# Patient Record
Sex: Female | Born: 1965 | Race: White | Hispanic: No | Marital: Married | Smoking: Current every day smoker
Health system: Southern US, Community
[De-identification: ages and names within clinical notes are randomized; demographics above are authoritative.]

## PROBLEM LIST (undated history)

## (undated) DIAGNOSIS — E559 Vitamin D deficiency, unspecified: Secondary | ICD-10-CM

## (undated) DIAGNOSIS — E538 Deficiency of other specified B group vitamins: Secondary | ICD-10-CM

## (undated) DIAGNOSIS — E079 Disorder of thyroid, unspecified: Secondary | ICD-10-CM

## (undated) DIAGNOSIS — R142 Eructation: Secondary | ICD-10-CM

## (undated) DIAGNOSIS — E785 Hyperlipidemia, unspecified: Secondary | ICD-10-CM

## (undated) DIAGNOSIS — I10 Essential (primary) hypertension: Secondary | ICD-10-CM

## (undated) HISTORY — DX: Deficiency of other specified B group vitamins: E53.8

## (undated) HISTORY — DX: Eructation: R14.2

## (undated) HISTORY — DX: Hyperlipidemia, unspecified: E78.5

## (undated) HISTORY — DX: Essential (primary) hypertension: I10

## (undated) HISTORY — PX: TUBAL LIGATION: SHX77

## (undated) HISTORY — PX: TONSILLECTOMY AND ADENOIDECTOMY: SHX28

## (undated) HISTORY — PX: COLONOSCOPY: SHX174

## (undated) HISTORY — PX: UPPER GASTROINTESTINAL ENDOSCOPY: SHX188

## (undated) HISTORY — DX: Vitamin D deficiency, unspecified: E55.9

## (undated) HISTORY — DX: Disorder of thyroid, unspecified: E07.9

---

## 2008-03-06 ENCOUNTER — Emergency Department (HOSPITAL_COMMUNITY): Admission: EM | Admit: 2008-03-06 | Discharge: 2008-03-06 | Payer: Self-pay | Admitting: Emergency Medicine

## 2009-08-09 ENCOUNTER — Ambulatory Visit: Payer: Self-pay | Admitting: Cardiology

## 2013-08-17 ENCOUNTER — Encounter: Payer: Self-pay | Admitting: Neurology

## 2013-08-18 ENCOUNTER — Ambulatory Visit: Payer: Self-pay | Admitting: Neurology

## 2013-08-18 ENCOUNTER — Encounter: Payer: Self-pay | Admitting: Neurology

## 2013-08-18 ENCOUNTER — Ambulatory Visit (INDEPENDENT_AMBULATORY_CARE_PROVIDER_SITE_OTHER): Payer: PRIVATE HEALTH INSURANCE | Admitting: Neurology

## 2013-08-18 ENCOUNTER — Encounter (INDEPENDENT_AMBULATORY_CARE_PROVIDER_SITE_OTHER): Payer: Self-pay

## 2013-08-18 VITALS — BP 129/83 | HR 78 | Ht 62.5 in | Wt 161.0 lb

## 2013-08-18 DIAGNOSIS — H532 Diplopia: Secondary | ICD-10-CM

## 2013-08-18 DIAGNOSIS — G43909 Migraine, unspecified, not intractable, without status migrainosus: Secondary | ICD-10-CM | POA: Insufficient documentation

## 2013-08-18 DIAGNOSIS — R42 Dizziness and giddiness: Secondary | ICD-10-CM | POA: Insufficient documentation
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
## 2013-08-25 ENCOUNTER — Other Ambulatory Visit: Payer: PRIVATE HEALTH INSURANCE

## 2013-08-26 ENCOUNTER — Telehealth: Payer: Self-pay | Admitting: Neurology

## 2013-08-26 NOTE — Telephone Encounter (Signed)
Patrice with [facility] @ [phone], requesting MRI order scheduled 7/13.  Please fax order to [phone].

## 2013-08-29 NOTE — Telephone Encounter (Signed)
Pt's information was faxed over to [facility], attn: Seymour, confirmation received.

## 2013-08-31 ENCOUNTER — Other Ambulatory Visit: Payer: PRIVATE HEALTH INSURANCE

## 2013-08-31 ENCOUNTER — Telehealth: Payer: Self-pay | Admitting: Neurology

## 2013-08-31 NOTE — Telephone Encounter (Signed)
Pt called back and I informed pt of the information that I receive from [facility], stating that they were going to fax the results over to our office. Pt verbalized understanding.

## 2013-08-31 NOTE — Telephone Encounter (Signed)
I have called her MRI result from [facility], MRI cervical showed moderate sized left paracentral, disc protrusion, at C6 and 7, causing left foraminal stenosis, with impingement on the existing left C7 nerve roots, mild impingement on the left side of the cervical cord, and the minimum central canal stenosis  MRI of the brain showed mild small vessel disease,  I have advised her to bring MRI CD to review, keep her appointment in July 27

## 2013-08-31 NOTE — Telephone Encounter (Signed)
Dr.Yan see your in box patient calling about MRI.

## 2013-08-31 NOTE — Telephone Encounter (Signed)
Called pt and could not leave a voice mail for pt, because pt does not have one set up yet. I wanted to let the pt know that we have not receive the MRI results, but I did request them and [facility] will be faxing the results over to our office.

## 2013-09-06 ENCOUNTER — Encounter: Payer: Self-pay | Admitting: Neurology

## 2013-09-13 ENCOUNTER — Ambulatory Visit: Payer: PRIVATE HEALTH INSURANCE | Admitting: Neurology

## 2013-09-14 ENCOUNTER — Ambulatory Visit (INDEPENDENT_AMBULATORY_CARE_PROVIDER_SITE_OTHER): Payer: PRIVATE HEALTH INSURANCE | Admitting: Neurology

## 2013-09-14 ENCOUNTER — Encounter: Payer: Self-pay | Admitting: Neurology

## 2013-09-14 VITALS — BP 129/83 | HR 71 | Ht 62.5 in | Wt 161.0 lb

## 2013-09-14 DIAGNOSIS — M542 Cervicalgia: Secondary | ICD-10-CM

## 2013-09-14 MED ORDER — TOPIRAMATE 25 MG PO TABS: ORAL_TABLET | ORAL | Status: DC
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
## 2013-09-15 ENCOUNTER — Telehealth: Payer: Self-pay | Admitting: Neurology

## 2013-09-15 NOTE — Telephone Encounter (Signed)
Called [name] back and left her a message asking her to call me back and she could just get me paged so we want play phone tag.

## 2013-09-15 NOTE — Telephone Encounter (Signed)
[name] from [facility] calling to discuss with Sherry Cameron the images that are on the CD that we have, wants to make sure we know how to access the images before she sends another one. Please return call and advise.

## 2013-09-15 NOTE — Telephone Encounter (Signed)
Called back and left her another message. We missed each other call again.

## 2013-09-16 NOTE — Telephone Encounter (Signed)
Called back and left another message we do need CD mailed.

## 2013-09-20 NOTE — Telephone Encounter (Signed)
I have talked with her insurance, peer to peer review for MRA,   MRA brain [id number] valid October 15 2013. and neck, [id number]

## 2013-09-22 NOTE — Telephone Encounter (Signed)
Noted  

## 2013-09-26 ENCOUNTER — Telehealth: Payer: Self-pay | Admitting: Neurology

## 2013-09-26 NOTE — Telephone Encounter (Signed)
° °  Last times she saw Dr. Krista Blue was a couple weeks ago Issues she was having last night scared her (numbness of the right side of her face lasting 4-5 hours) Best number [phone]

## 2013-09-26 NOTE — Telephone Encounter (Signed)
Please call patient, extensive evaluation showed no significant abnormality so far, will address her questions during her scheduled visit in August 20

## 2013-09-26 NOTE — Telephone Encounter (Signed)
Patient saw you on 09/14/13 with neck pain. She saw you on 08/18/13 vertigo/double vision. She called today with complaint of facial numbness on last night. What are your recommendations?

## 2013-09-26 NOTE — Telephone Encounter (Signed)
Called patient back and informed her that if the facial numbness continues she should go to the ER. Dr. Krista Blue will address this concern on 8/20 appointment. Patient verbalizes understanding.

## 2013-10-05 ENCOUNTER — Encounter: Payer: Self-pay | Admitting: Neurology

## 2013-10-06 ENCOUNTER — Ambulatory Visit (INDEPENDENT_AMBULATORY_CARE_PROVIDER_SITE_OTHER): Payer: PRIVATE HEALTH INSURANCE | Admitting: Neurology

## 2013-10-06 ENCOUNTER — Encounter (INDEPENDENT_AMBULATORY_CARE_PROVIDER_SITE_OTHER): Payer: Self-pay

## 2013-10-06 DIAGNOSIS — Z0289 Encounter for other administrative examinations: Secondary | ICD-10-CM

## 2013-10-06 DIAGNOSIS — M542 Cervicalgia: Secondary | ICD-10-CM
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
## 2013-10-07 ENCOUNTER — Telehealth: Payer: Self-pay | Admitting: Neurology

## 2013-10-07 NOTE — Telephone Encounter (Signed)
I called patient, but I was able to review her MRIs CDs,

## 2013-10-07 NOTE — Procedures (Signed)
   NCS (NERVE CONDUCTION STUDY) WITH EMG (ELECTROMYOGRAPHY) REPORT   STUDY DATE: October 06 2013 PATIENT NAME: Sherry Cameron DOB: [birth year] MRN: [id number]    TECHNOLOGIST: Laretta Alstrom ELECTROMYOGRAPHER: Marcial Pacas M.D.  CLINICAL INFORMATION:   48 years old right-handed female, complains of dizziness spells, MRI of cervical spine has demonstrated leftward herniated disc at left C6, 7, with potential foraminal stenosis  FINDINGS: NERVE CONDUCTION STUDY:  Bilateral median, ulnar sensory and motor responses were normal.  NEEDLE ELECTROMYOGRAPHY: Selected needle examination was performed at left upper extremity muscles, in the left cervical paraspinal muscles.  Left biceps, triceps, deltoid, extensor digital communis, flexor carpi ulnaris, pronator teres. was normal  There was no spontaneous activity at left cervical paraspinal muscles, left C6-7-5  IMPRESSION:   This is a normal study. There was no electrodiagnostic evidence of left upper extremity neuropathy, or left cervical radiculopathy   INTERPRETING PHYSICIAN:   Marcial Pacas M.D. Ph.D. [facility] [address], [place],  [address] [phone]

## 2014-01-24 ENCOUNTER — Telehealth: Payer: Self-pay | Admitting: *Deleted

## 2014-01-24 NOTE — Telephone Encounter (Signed)
Fax request to [facility] on 01/24/14.

## 2014-01-25 ENCOUNTER — Telehealth: Payer: Self-pay | Admitting: *Deleted

## 2014-01-25 NOTE — Telephone Encounter (Signed)
Note from [facility] receive on 01/25/14.

## 2014-01-27 ENCOUNTER — Ambulatory Visit: Payer: PRIVATE HEALTH INSURANCE | Admitting: Neurology

## 2014-01-27 NOTE — Telephone Encounter (Signed)
Erroneous encounter

## 2014-02-01 ENCOUNTER — Telehealth: Payer: Self-pay | Admitting: Neurology

## 2014-02-01 NOTE — Telephone Encounter (Signed)
I have reviewed MRI report from [facility] dated September 23 2013, MR a of neck showed no significant carotid artery stenosis, mild smooth narrowing of the distal right vertebral artery, cervical spine normal spinal cord signal intensity, moderate left paracentral disc protrusion at C6-7:, Causing left foraminal stenosis, with impingement on the existing left C7 nerve root was, mild impingement the left side of the cervical cord, and minimal central spinal stenosis, next  MRI of the brain, high intensity T2 weighted foci in the periventricular subcortical white matter, has a typical aches appearance of white matter ischemic changes, this findings does not have the typical appearance of Dawson's fingers, which is associated with MS,   Lumbar x-ray no acute findings  Cervical x-ray degenerative disc disease,  Pelvic AP no acute findings

## 2016-03-20 ENCOUNTER — Encounter: Payer: Self-pay | Admitting: Gastroenterology

## 2016-05-02 ENCOUNTER — Ambulatory Visit (INDEPENDENT_AMBULATORY_CARE_PROVIDER_SITE_OTHER): Payer: 59 | Admitting: Gastroenterology

## 2016-05-02 ENCOUNTER — Encounter (INDEPENDENT_AMBULATORY_CARE_PROVIDER_SITE_OTHER): Payer: Self-pay

## 2016-05-02 ENCOUNTER — Encounter: Payer: Self-pay | Admitting: Gastroenterology

## 2016-05-02 VITALS — BP 110/70 | HR 76 | Ht 62.5 in | Wt 159.0 lb

## 2016-05-02 DIAGNOSIS — R142 Eructation: Secondary | ICD-10-CM

## 2016-05-02 DIAGNOSIS — R110 Nausea: Secondary | ICD-10-CM | POA: Diagnosis not present

## 2016-05-02 DIAGNOSIS — R140 Abdominal distension (gaseous): Secondary | ICD-10-CM | POA: Diagnosis not present

## 2016-05-02 DIAGNOSIS — K582 Mixed irritable bowel syndrome: Secondary | ICD-10-CM

## 2016-05-02 MED ORDER — ONDANSETRON HCL 4 MG PO TABS: 4.0000 mg | ORAL_TABLET | Freq: Three times a day (TID) | ORAL | 1 refills | Status: DC | PRN

## 2016-05-02 NOTE — Patient Instructions (Signed)
If you are age 53 or older, your body mass index should be between 23-30. Your Body mass index is 28.62 kg/m. If this is out of the aforementioned range listed, please consider follow up with your Primary Care Provider.  If you are age 39 or younger, your body mass index should be between 19-25. Your Body mass index is 28.62 kg/m. If this is out of the aformentioned range listed, please consider follow up with your Primary Care Provider.   Please call in 2-3 weeks with an update. If no improvement we will proceed with the upper endoscopy as discussed. Allura RN- [phone]   Thank you for choosing [facility]  Dr Wilfrid Lund III

## 2016-05-02 NOTE — Progress Notes (Signed)
[facility] Consult Note:  History: Sherry Cameron 05/02/2016  Referring physician: Talmage Coin, MD  Reason for consult/chief complaint: Nausea (x3 years; occurs frequently but is becoming even more frequent now; started initially after drinking large amounts of soda-she stopped this sometime ago but nausea never got better; nausea is worse in the morning and wakes her at night; can also be bad in evenings; no vomiting); belching (frequent belching); and change in bowels (has some constipation and diarrhea; constipation is new in the last 2 years; does have some sweating and lower abdominal cramping before bowel movements)   Subjective  HPI:  This 51 year old woman referred by primary care for 3 years of persistent nausea. She says she was evaluated with both upper endoscopy as well as gallbladder ultrasound and HIDA scan. This is all by the patient report, as no records are currently available. She says symptoms got much better when she stopped drinking soda with artificial sweetener. However things have gradually returned, she also has associated belching, and generalized abdominal bloating with gas "trapped in my stomach all the way up to my chest". She has many years of all thank constipation and diarrhea with some lower abdominal cramps and sweating and pain on the bottom of her feet after eating certain foods such as salads. The nausea almost always occurs upon awakening in the morning, and sometimes happens overnight and wakes her from sleep. She denies rectal bleeding, appetite is been good and weight stable. 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 She has had several episodes over the last 6-9 months that she describes as a "gallbladder attack". She will have tight chronic crampy upper abdominal pain that will last about half an hour and finally subsided. It does not occur with meals.  ROS:  Review of Systems  Constitutional: Negative for  appetite change and unexpected weight change.  HENT: Negative for mouth sores and voice change.   Eyes: Negative for pain and redness.  Respiratory: Negative for cough and shortness of breath.   Cardiovascular: Negative for chest pain and palpitations.  Genitourinary: Negative for dysuria and hematuria.  Musculoskeletal: Negative for arthralgias and myalgias.  Skin: Negative for pallor and rash.  Neurological: Negative for weakness and headaches.  Hematological: Negative for adenopathy.   She denies blurred or double vision or gait unsteadiness.  Past Medical History: Past Medical History:  Diagnosis Date  . Hyperlipidemia   . Migraine   . Thyroid disease      Past Surgical History: Past Surgical History:  Procedure Laterality Date  . TONSILLECTOMY AND ADENOIDECTOMY    . TUBAL LIGATION       Family History: Family History  Problem Relation Age of Onset  . Dementia Mother   . Stroke Mother   . Cancer - Lung Father   . Esophageal cancer Father     felt secondary to lung cancer  . Heart disease Brother     felt related to drug use  . Diabetes Brother   . Colon cancer Neg Hx   . Colon polyps Neg Hx     Social History: Social History   Social History  . Marital status: Married    Spouse name: N/A  . Number of children: 2  . Years of education: 12   Occupational History  .      Shipping Mgr.   Social History Main Topics  . Smoking status: Current Every Day Smoker  . Smokeless tobacco: Never Used  . Alcohol  use No  . Drug use: No  . Sexual activity: Not Asked   Other Topics Concern  . None   Social History Narrative   Patient lives at home with her husband. Patient works full time as a Loss adjuster, chartered.    Education  High school.   Right handed.   Caffeine three sweet teas daily.          Allergies: Allergies  Allergen Reactions  . Augmentin [Amoxicillin-Pot Clavulanate] Other (See Comments)  . Contrast Media [Iodinated Diagnostic Agents]  Itching    MRI CONTRAST-    Outpatient Meds: Current Outpatient Prescriptions  Medication Sig Dispense Refill  . CARTIA XT 240 MG 24 hr capsule     . clonazePAM (KLONOPIN) 0.5 MG tablet Take 0.5 mg by mouth 2 (two) times daily as needed for anxiety.    Marland Kitchen diltiazem (DILACOR XR) 240 MG 24 hr capsule Take 240 mg by mouth daily.    Marland Kitchen escitalopram (LEXAPRO) 20 MG tablet Take 20 mg by mouth daily.    . hydrochlorothiazide (HYDRODIURIL) 25 MG tablet Take 25 mg by mouth daily.    Marland Kitchen ibuprofen (ADVIL,MOTRIN) 800 MG tablet Take 800 mg by mouth every 8 (eight) hours as needed.    Marland Kitchen levothyroxine (SYNTHROID, LEVOTHROID) 112 MCG tablet Take 112 mcg by mouth daily before breakfast.    . omeprazole (PRILOSEC) 20 MG capsule Take 20 mg by mouth daily.    . potassium chloride (KLOR-CON) 20 MEQ packet Take 20 mEq by mouth 2 (two) times daily.    . ondansetron (ZOFRAN) 4 MG tablet Take 1 tablet (4 mg total) by mouth every 8 (eight) hours as needed for nausea or vomiting. 45 tablet 1   No current facility-administered medications for this visit.       ___________________________________________________________________ Objective   Exam:  BP 110/70   Pulse 76   Ht 5' 2.5" (1.588 m)   Wt 159 lb (72.1 kg)   BMI 28.62 kg/m    General: this is a(n) Well-appearing woman, normal vocal quality, no muscle wasting   Eyes: sclera anicteric, no redness  ENT: oral mucosa moist without lesions, no cervical or supraclavicular lymphadenopathy, good dentition  CV: RRR without murmur, S1/S2, no JVD, no peripheral edema  Resp: clear to auscultation bilaterally, normal RR and effort noted  GI: soft, mild LUQ tenderness, with active bowel sounds. No guarding or palpable organomegaly noted.  Skin; warm and dry, no rash or jaundice noted  Neuro: awake, alert and oriented x 3. Normal gross motor function and fluent speech  Labs:  Reports low iron, no records Reports neg FOBT  No imaging reports. She  appears to have had an MRI of the brain in July 2015, but the report cannot be pulled up from Epic. She recalls being told it was normal.   Assessment: Encounter Diagnoses  Name Primary?  . Nausea without vomiting Yes  . Belching   . Abdominal bloating   . Irritable bowel syndrome with both constipation and diarrhea     Where she has had a previous endoscopic workup for this, the yield of an endoscopy at this point seems likely to be low. She seems to have symptoms of a global functional disorder.  Plan:  IBS common food intolerance dietary recommendations given. 2-3 week trial of ondansetron 4 mg every 8 hours as needed She will call us with an update at that point. If not improved, we can pursue an upper endoscopy.  Thank you for the courtesy of  this consult.  Please call me with any questions or concerns.  Nelida Meuse III  CC: Talmage Coin, MD

## 2016-05-27 ENCOUNTER — Telehealth: Payer: Self-pay | Admitting: Gastroenterology

## 2016-05-27 NOTE — Telephone Encounter (Signed)
Yes, I would like to proceed with EGD if she is agreeable.  If she has not already done so, please eliminate all caffeine.

## 2016-05-27 NOTE — Telephone Encounter (Signed)
Patient has been taking the ondansetron every day, sometimes BID and is still having nausea. Looking at last note looks like you had suggested next step of EGD. Is this how you want to proceed?

## 2016-05-28 NOTE — Telephone Encounter (Signed)
Patient would like to proceed with the EGD, will begin to eliminate caffeine. Scheduled for pre-visit on 4/13 and procedure on 5/7.

## 2016-05-30 ENCOUNTER — Ambulatory Visit (AMBULATORY_SURGERY_CENTER): Payer: Self-pay

## 2016-05-30 VITALS — Ht 62.0 in | Wt 158.0 lb

## 2016-05-30 DIAGNOSIS — R110 Nausea: Secondary | ICD-10-CM

## 2016-05-30 NOTE — Progress Notes (Signed)
No allergies to eggs or soy No diet meds No home oxygen No past problems with anesthesia  Registered emmi 

## 2016-06-23 ENCOUNTER — Encounter: Payer: Self-pay | Admitting: Gastroenterology

## 2016-06-23 ENCOUNTER — Ambulatory Visit (AMBULATORY_SURGERY_CENTER): Payer: 59 | Admitting: Gastroenterology

## 2016-06-23 VITALS — BP 136/79 | HR 66 | Temp 98.7°F | Resp 15 | Ht 62.0 in | Wt 158.0 lb

## 2016-06-23 DIAGNOSIS — R1033 Periumbilical pain: Secondary | ICD-10-CM | POA: Diagnosis not present

## 2016-06-23 DIAGNOSIS — R110 Nausea: Secondary | ICD-10-CM

## 2016-06-23 DIAGNOSIS — R197 Diarrhea, unspecified: Secondary | ICD-10-CM | POA: Diagnosis not present

## 2016-06-23 DIAGNOSIS — K3189 Other diseases of stomach and duodenum: Secondary | ICD-10-CM | POA: Diagnosis not present

## 2016-06-23 DIAGNOSIS — K2950 Unspecified chronic gastritis without bleeding: Secondary | ICD-10-CM

## 2016-06-23 MED ORDER — SODIUM CHLORIDE 0.9 % IV SOLN
500.0000 mL | INTRAVENOUS | Status: DC
Start: 2016-06-23 — End: 2017-08-24

## 2016-06-23 NOTE — Progress Notes (Signed)
A/ox3, pleased with MAC, report to RN 

## 2016-06-23 NOTE — Progress Notes (Signed)
Pt. Reports no change in surgical or medical history since her pre-visit 05/30/2016.

## 2016-06-23 NOTE — Op Note (Signed)
[facility] Patient Name: Sherry Cameron Procedure Date: 06/23/2016 10:11 AM MRN: [id number] Endoscopist: Mallie Mussel L. Loletha Carrow , MD Age: 51 Referring MD:  Date of Birth: [birth year] Gender: Female Account #: [id number] Procedure:                Upper GI endoscopy Indications:              Periumbilical abdominal pain, Diarrhea (alternating                            with constipation), Nausea Medicines:                Monitored Anesthesia Care Procedure:                Pre-Anesthesia Assessment:                           - Prior to the procedure, a History and Physical                            was performed, and patient medications and                            allergies were reviewed. The patient's tolerance of                            previous anesthesia was also reviewed. The risks                            and benefits of the procedure and the sedation                            options and risks were discussed with the patient.                            All questions were answered, and informed consent                            was obtained. Prior Anticoagulants: The patient has                            taken no previous anticoagulant or antiplatelet                            agents. ASA Grade Assessment: II - A patient with                            mild systemic disease. After reviewing the risks                            and benefits, the patient was deemed in                            satisfactory condition to undergo the procedure.  After obtaining informed consent, the endoscope was                            passed under direct vision. Throughout the                            procedure, the patient's blood pressure, pulse, and                            oxygen saturations were monitored continuously. The                            Endoscope was introduced through the mouth, and                            advanced to the second part of  duodenum. The upper                            GI endoscopy was accomplished without difficulty.                            The patient tolerated the procedure well. Scope In: Scope Out: Findings:                 The esophagus was normal.                           The entire examined stomach was normal. Biopsies                            were taken from the antrum with a cold forceps for                            histology.                           The cardia and gastric fundus were normal on                            retroflexion.                           The examined duodenum was normal. Biopsies for                            histology were taken with a cold forceps for                            evaluation of celiac disease. Complications:            No immediate complications. Estimated Blood Loss:     Estimated blood loss: none. Impression:               - Normal esophagus.                           - Normal  stomach. Biopsied.                           - Normal examined duodenum. Biopsied.                           If biopsies are normal, the overall clinical                            picture is most consistent with irritable bowel                            syndrome. Recommendation:           - Patient has a contact number available for                            emergencies. The signs and symptoms of potential                            delayed complications were discussed with the                            patient. Return to normal activities tomorrow.                            Written discharge instructions were provided to the                            patient.                           - Resume previous diet.                           - Continue present medications.                           - Await pathology results. Maribel Luis L. Loletha Carrow, MD 06/23/2016 10:31:47 AM This report has been signed electronically.

## 2016-06-23 NOTE — Patient Instructions (Signed)
YOU HAD AN ENDOSCOPIC PROCEDURE TODAY: Refer to the procedure report and other information in the discharge instructions given to you for any specific questions about what was found during the examination. If this information does not answer your questions, please call [place] office at [phone] to clarify.   YOU SHOULD EXPECT: Some feelings of bloating in the abdomen. Passage of more gas than usual. Walking can help get rid of the air that was put into your GI tract during the procedure and reduce the bloating. If you had a lower endoscopy (such as a colonoscopy or flexible sigmoidoscopy) you may notice spotting of blood in your stool or on the toilet paper. Some abdominal soreness may be present for a day or two, also.  DIET: Your first meal following the procedure should be a light meal and then it is ok to progress to your normal diet. A half-sandwich or bowl of soup is an example of a good first meal. Heavy or fried foods are harder to digest and may make you feel nauseous or bloated. Drink plenty of fluids but you should avoid alcoholic beverages for 24 hours. If you had a esophageal dilation, please see attached instructions for diet.    ACTIVITY: Your care partner should take you home directly after the procedure. You should plan to take it easy, moving slowly for the rest of the day. You can resume normal activity the day after the procedure however YOU SHOULD NOT DRIVE, use power tools, machinery or perform tasks that involve climbing or major physical exertion for 24 hours (because of the sedation medicines used during the test).   SYMPTOMS TO REPORT IMMEDIATELY: A gastroenterologist can be reached at any hour. Please call [phone]  for any of the following symptoms:   Following upper endoscopy (EGD, EUS, ERCP, esophageal dilation) Vomiting of blood or coffee ground material  New, significant abdominal pain  New, significant chest pain or pain under the shoulder blades  Painful or  persistently difficult swallowing  New shortness of breath  Black, tarry-looking or red, bloody stools  FOLLOW UP:  If any biopsies were taken you will be contacted by phone or by letter within the next 1-3 weeks. Call [phone]  if you have not heard about the biopsies in 3 weeks.  Please also call with any specific questions about appointments or follow up tests.  

## 2016-06-23 NOTE — Progress Notes (Signed)
Called to room to assist during endoscopic procedure.  Patient ID and intended procedure confirmed with present staff. Received instructions for my participation in the procedure from the performing physician.  

## 2016-06-24 ENCOUNTER — Telehealth: Payer: Self-pay | Admitting: *Deleted

## 2016-06-24 NOTE — Telephone Encounter (Signed)
  Follow up Call-  Call back number 06/23/2016  Post procedure Call Back phone  # [phone]  [phone]  Permission to leave phone message Yes  Some recent data might be hidden     Patient questions:  Do you have a fever, pain , or abdominal swelling? No. Pain Score  0 *  Have you tolerated food without any problems? Yes.    Have you been able to return to your normal activities? Yes.    Do you have any questions about your discharge instructions: Diet   No. Medications  No. Follow up visit  No   Do you have questions or concerns about your Care? No.  Actions: * If pain score is 4 or above: No action needed, pain <4.

## 2016-06-26 ENCOUNTER — Other Ambulatory Visit: Payer: Self-pay

## 2016-06-26 MED ORDER — PROCHLORPERAZINE MALEATE 10 MG PO TABS: 10.0000 mg | ORAL_TABLET | Freq: Every evening | ORAL | 0 refills | Status: DC | PRN

## 2016-07-09 ENCOUNTER — Other Ambulatory Visit: Payer: Self-pay

## 2016-07-09 ENCOUNTER — Telehealth: Payer: Self-pay | Admitting: Gastroenterology

## 2016-07-09 DIAGNOSIS — R1013 Epigastric pain: Secondary | ICD-10-CM

## 2016-07-09 MED ORDER — TRAMADOL HCL 50 MG PO TABS: 50.0000 mg | ORAL_TABLET | Freq: Three times a day (TID) | ORAL | 1 refills | Status: DC | PRN

## 2016-07-09 NOTE — Telephone Encounter (Signed)
Called patient to let her know that I've scheduled her RUQ [place] for this Friday at 5/25 arrive at 8:15 for 8:30 at [facility]. She needs to be NPO after midnight. Also called in pain medication to [facility] in [place], [place]. Patient asked if she could call back as she is at a graduation ceremony this morning.

## 2016-07-09 NOTE — Telephone Encounter (Signed)
Yes, that does sound like a gallbladder attack. Also sounds more severe than the AM nausea she was describing before.  Her [place] workup was years ago.  She needs a RUQ ultrasound this week or next week.  Please send Rx for tramadol 50 mg, one tablet by mouth every 8 hour as needed for pain (in case has another attack before we figure this out).  Disp #12, RF 1

## 2016-07-09 NOTE — Telephone Encounter (Signed)
Patient reports that around 11:00 pm last night, she started having severe epigastric pain, radiating sternally and to her back. Reports having nausea but no vomiting. She also had white stools yesterday. She is taking her medication as directed. She says that the pain is easing up a little. Patient states she had a workup for gallbladder at [facility].

## 2016-07-09 NOTE — Telephone Encounter (Signed)
Patient called back and she is aware of time of Korea and what time she needs to arrive at [facility].

## 2016-07-11 ENCOUNTER — Ambulatory Visit (HOSPITAL_COMMUNITY): Payer: 59

## 2016-07-11 ENCOUNTER — Observation Stay (HOSPITAL_COMMUNITY): Payer: 59 | Admitting: Anesthesiology

## 2016-07-11 ENCOUNTER — Emergency Department (HOSPITAL_COMMUNITY): Payer: 59

## 2016-07-11 ENCOUNTER — Encounter (HOSPITAL_COMMUNITY): Payer: Self-pay | Admitting: Emergency Medicine

## 2016-07-11 ENCOUNTER — Observation Stay (HOSPITAL_COMMUNITY)
Admission: EM | Admit: 2016-07-11 | Discharge: 2016-07-11 | Disposition: A | Discharge status: Home / Self Care | Payer: 59 | Attending: Surgery | Admitting: Surgery

## 2016-07-11 ENCOUNTER — Encounter (HOSPITAL_COMMUNITY)
Admission: EM | Disposition: A | Discharge status: Home / Self Care | Payer: Self-pay | Source: Home / Self Care | Attending: Emergency Medicine

## 2016-07-11 DIAGNOSIS — Z880 Allergy status to penicillin: Secondary | ICD-10-CM | POA: Diagnosis not present

## 2016-07-11 DIAGNOSIS — F419 Anxiety disorder, unspecified: Secondary | ICD-10-CM | POA: Diagnosis not present

## 2016-07-11 DIAGNOSIS — F329 Major depressive disorder, single episode, unspecified: Secondary | ICD-10-CM | POA: Diagnosis not present

## 2016-07-11 DIAGNOSIS — E039 Hypothyroidism, unspecified: Secondary | ICD-10-CM | POA: Diagnosis not present

## 2016-07-11 DIAGNOSIS — I10 Essential (primary) hypertension: Secondary | ICD-10-CM | POA: Diagnosis not present

## 2016-07-11 DIAGNOSIS — R1011 Right upper quadrant pain: Secondary | ICD-10-CM | POA: Diagnosis present

## 2016-07-11 DIAGNOSIS — F17210 Nicotine dependence, cigarettes, uncomplicated: Secondary | ICD-10-CM | POA: Insufficient documentation

## 2016-07-11 DIAGNOSIS — K8010 Calculus of gallbladder with chronic cholecystitis without obstruction: Principal | ICD-10-CM | POA: Insufficient documentation

## 2016-07-11 DIAGNOSIS — Z79899 Other long term (current) drug therapy: Secondary | ICD-10-CM | POA: Insufficient documentation

## 2016-07-11 DIAGNOSIS — Z91041 Radiographic dye allergy status: Secondary | ICD-10-CM | POA: Diagnosis not present

## 2016-07-11 DIAGNOSIS — K8020 Calculus of gallbladder without cholecystitis without obstruction: Secondary | ICD-10-CM | POA: Diagnosis present

## 2016-07-11 HISTORY — PX: CHOLECYSTECTOMY: SHX55

## 2016-07-11 LAB — CBC
HCT: 37.9 % (ref 36.0–46.0)
HEMOGLOBIN: 12 g/dL (ref 12.0–15.0)
MCH: 25.3 pg — AB (ref 26.0–34.0)
MCHC: 31.7 g/dL (ref 30.0–36.0)
MCV: 79.8 fL (ref 78.0–100.0)
Platelets: 377 10*3/uL (ref 150–400)
RBC: 4.75 MIL/uL (ref 3.87–5.11)
RDW: 15.4 % (ref 11.5–15.5)
WBC: 10.9 10*3/uL — ABNORMAL HIGH (ref 4.0–10.5)

## 2016-07-11 LAB — COMPREHENSIVE METABOLIC PANEL
ALK PHOS: 119 U/L (ref 38–126)
ALT: 16 U/L (ref 14–54)
AST: 20 U/L (ref 15–41)
Albumin: 4.4 g/dL (ref 3.5–5.0)
Anion gap: 10 (ref 5–15)
BUN: 15 mg/dL (ref 6–20)
CALCIUM: 9.5 mg/dL (ref 8.9–10.3)
CO2: 25 mmol/L (ref 22–32)
CREATININE: 1 mg/dL (ref 0.44–1.00)
Chloride: 103 mmol/L (ref 101–111)
GFR calc Af Amer: >60 mL/min (ref >60)
GFR calc non Af Amer: >60 mL/min (ref >60)
Glucose, Bld: 124 mg/dL — ABNORMAL HIGH (ref 65–99)
Potassium: 3.6 mmol/L (ref 3.5–5.1)
Sodium: 138 mmol/L (ref 135–145)
Total Bilirubin: 0.4 mg/dL (ref 0.3–1.2)
Total Protein: 8 g/dL (ref 6.5–8.1)

## 2016-07-11 LAB — LIPASE, BLOOD: Lipase: 26 U/L (ref 11–51)

## 2016-07-11 SURGERY — LAPAROSCOPIC CHOLECYSTECTOMY WITH INTRAOPERATIVE CHOLANGIOGRAM
Anesthesia: General | Site: Abdomen

## 2016-07-11 MED ORDER — ROCURONIUM BROMIDE 50 MG/5ML IV SOSY
PREFILLED_SYRINGE | INTRAVENOUS | Status: AC
  Filled 2016-07-11: qty 5

## 2016-07-11 MED ORDER — CHLORHEXIDINE GLUCONATE CLOTH 2 % EX PADS: 6.0000 | MEDICATED_PAD | Freq: Once | CUTANEOUS | Status: DC

## 2016-07-11 MED ORDER — ROCURONIUM BROMIDE 10 MG/ML (PF) SYRINGE
PREFILLED_SYRINGE | INTRAVENOUS | Status: DC | PRN
  Administered 2016-07-11: 10 mg via INTRAVENOUS
  Administered 2016-07-11: 40 mg via INTRAVENOUS

## 2016-07-11 MED ORDER — ONDANSETRON HCL 4 MG/2ML IJ SOLN
INTRAMUSCULAR | Status: AC
  Filled 2016-07-11: qty 2

## 2016-07-11 MED ORDER — ACETAMINOPHEN 325 MG PO TABS: 650.0000 mg | ORAL_TABLET | Freq: Four times a day (QID) | ORAL | Status: DC | PRN

## 2016-07-11 MED ORDER — MORPHINE SULFATE (PF) 2 MG/ML IV SOLN: 1.0000 mg | INTRAVENOUS | Status: DC | PRN

## 2016-07-11 MED ORDER — CIPROFLOXACIN IN D5W 400 MG/200ML IV SOLN: 400.0000 mg | INTRAVENOUS | Status: DC

## 2016-07-11 MED ORDER — ONDANSETRON HCL 4 MG/2ML IJ SOLN: 4.0000 mg | Freq: Once | INTRAMUSCULAR | Status: DC | PRN

## 2016-07-11 MED ORDER — ONDANSETRON HCL 4 MG/2ML IJ SOLN: 4.0000 mg | Freq: Four times a day (QID) | INTRAMUSCULAR | Status: DC | PRN

## 2016-07-11 MED ORDER — DILTIAZEM HCL ER COATED BEADS 240 MG PO CP24
240.0000 mg | ORAL_CAPSULE | Freq: Every evening | ORAL | Status: DC
Start: 2016-07-11 — End: 2016-07-11

## 2016-07-11 MED ORDER — BUPIVACAINE HCL (PF) 0.25 % IJ SOLN
INTRAMUSCULAR | Status: AC
  Filled 2016-07-11: qty 30

## 2016-07-11 MED ORDER — ACETAMINOPHEN 650 MG RE SUPP: 650.0000 mg | Freq: Four times a day (QID) | RECTAL | Status: DC | PRN

## 2016-07-11 MED ORDER — LIDOCAINE 2% (20 MG/ML) 5 ML SYRINGE
INTRAMUSCULAR | Status: AC
  Filled 2016-07-11: qty 5

## 2016-07-11 MED ORDER — SUCCINYLCHOLINE CHLORIDE 200 MG/10ML IV SOSY
PREFILLED_SYRINGE | INTRAVENOUS | Status: AC
  Filled 2016-07-11: qty 10

## 2016-07-11 MED ORDER — DIPHENHYDRAMINE HCL 25 MG PO CAPS: 25.0000 mg | ORAL_CAPSULE | Freq: Four times a day (QID) | ORAL | Status: DC | PRN

## 2016-07-11 MED ORDER — CIPROFLOXACIN IN D5W 400 MG/200ML IV SOLN
400.0000 mg | Freq: Two times a day (BID) | INTRAVENOUS | Status: DC
  Administered 2016-07-11: 400 mg via INTRAVENOUS

## 2016-07-11 MED ORDER — PROPOFOL 10 MG/ML IV BOLUS
INTRAVENOUS | Status: DC | PRN
  Administered 2016-07-11: 150 mg via INTRAVENOUS

## 2016-07-11 MED ORDER — DEXAMETHASONE SODIUM PHOSPHATE 10 MG/ML IJ SOLN
INTRAMUSCULAR | Status: DC | PRN
  Administered 2016-07-11: 10 mg via INTRAVENOUS

## 2016-07-11 MED ORDER — ONDANSETRON HCL 4 MG/2ML IJ SOLN
INTRAMUSCULAR | Status: DC | PRN
  Administered 2016-07-11: 4 mg via INTRAVENOUS

## 2016-07-11 MED ORDER — ENOXAPARIN SODIUM 40 MG/0.4ML ~~LOC~~ SOLN: 40.0000 mg | SUBCUTANEOUS | Status: DC

## 2016-07-11 MED ORDER — PANTOPRAZOLE SODIUM 40 MG IV SOLR: 40.0000 mg | Freq: Every day | INTRAVENOUS | Status: DC

## 2016-07-11 MED ORDER — PROMETHAZINE HCL 25 MG/ML IJ SOLN
25.0000 mg | Freq: Once | INTRAMUSCULAR | Status: AC
Start: 2016-07-11 — End: 2016-07-11
  Administered 2016-07-11: 25 mg via INTRAVENOUS
  Filled 2016-07-11: qty 1

## 2016-07-11 MED ORDER — DIPHENHYDRAMINE HCL 50 MG/ML IJ SOLN: 25.0000 mg | Freq: Four times a day (QID) | INTRAMUSCULAR | Status: DC | PRN

## 2016-07-11 MED ORDER — SUGAMMADEX SODIUM 200 MG/2ML IV SOLN
INTRAVENOUS | Status: DC | PRN
  Administered 2016-07-11: 150 mg via INTRAVENOUS

## 2016-07-11 MED ORDER — FENTANYL CITRATE (PF) 250 MCG/5ML IJ SOLN
INTRAMUSCULAR | Status: AC
Start: 2016-07-11 — End: 2016-07-11
  Filled 2016-07-11: qty 5

## 2016-07-11 MED ORDER — FENTANYL CITRATE (PF) 100 MCG/2ML IJ SOLN: 25.0000 ug | INTRAMUSCULAR | Status: DC | PRN

## 2016-07-11 MED ORDER — ONDANSETRON 4 MG PO TBDP: 4.0000 mg | ORAL_TABLET | Freq: Four times a day (QID) | ORAL | Status: DC | PRN

## 2016-07-11 MED ORDER — 0.9 % SODIUM CHLORIDE (POUR BTL) OPTIME
TOPICAL | Status: DC | PRN
  Administered 2016-07-11: 1000 mL

## 2016-07-11 MED ORDER — FENTANYL CITRATE (PF) 100 MCG/2ML IJ SOLN
INTRAMUSCULAR | Status: DC | PRN
  Administered 2016-07-11: 150 ug via INTRAVENOUS
  Administered 2016-07-11: 50 ug via INTRAVENOUS

## 2016-07-11 MED ORDER — CIPROFLOXACIN IN D5W 400 MG/200ML IV SOLN
INTRAVENOUS | Status: AC
  Filled 2016-07-11: qty 200

## 2016-07-11 MED ORDER — OXYCODONE HCL 5 MG PO TABS: 5.0000 mg | ORAL_TABLET | ORAL | 0 refills | Status: DC | PRN

## 2016-07-11 MED ORDER — SODIUM CHLORIDE 0.45 % IV SOLN: INTRAVENOUS | Status: DC

## 2016-07-11 MED ORDER — CLONAZEPAM 0.5 MG PO TABS: 0.5000 mg | ORAL_TABLET | Freq: Two times a day (BID) | ORAL | Status: DC | PRN

## 2016-07-11 MED ORDER — IOPAMIDOL (ISOVUE-300) INJECTION 61%
INTRAVENOUS | Status: AC
  Filled 2016-07-11: qty 50

## 2016-07-11 MED ORDER — SIMETHICONE 80 MG PO CHEW: 40.0000 mg | CHEWABLE_TABLET | Freq: Four times a day (QID) | ORAL | Status: DC | PRN

## 2016-07-11 MED ORDER — MIDAZOLAM HCL 2 MG/2ML IJ SOLN
INTRAMUSCULAR | Status: AC
  Filled 2016-07-11: qty 2

## 2016-07-11 MED ORDER — OXYCODONE HCL 5 MG PO TABS: 5.0000 mg | ORAL_TABLET | ORAL | Status: DC | PRN

## 2016-07-11 MED ORDER — PROPOFOL 10 MG/ML IV BOLUS
INTRAVENOUS | Status: AC
  Filled 2016-07-11: qty 20

## 2016-07-11 MED ORDER — MENTHOL 3 MG MT LOZG: 1.0000 | LOZENGE | OROMUCOSAL | Status: DC | PRN

## 2016-07-11 MED ORDER — LIDOCAINE 2% (20 MG/ML) 5 ML SYRINGE
INTRAMUSCULAR | Status: DC | PRN
  Administered 2016-07-11: 100 mg via INTRAVENOUS

## 2016-07-11 MED ORDER — HYDROMORPHONE HCL 1 MG/ML IJ SOLN
1.0000 mg | Freq: Once | INTRAMUSCULAR | Status: AC
  Administered 2016-07-11: 1 mg via INTRAVENOUS
  Filled 2016-07-11: qty 1

## 2016-07-11 MED ORDER — DEXAMETHASONE SODIUM PHOSPHATE 10 MG/ML IJ SOLN
INTRAMUSCULAR | Status: AC
  Filled 2016-07-11: qty 1

## 2016-07-11 MED ORDER — SODIUM CHLORIDE 0.9 % IV BOLUS (SEPSIS)
500.0000 mL | Freq: Once | INTRAVENOUS | Status: AC
  Administered 2016-07-11: 500 mL via INTRAVENOUS

## 2016-07-11 MED ORDER — LACTATED RINGERS IR SOLN
Status: DC | PRN
Start: 2016-07-11 — End: 2016-07-11
  Administered 2016-07-11: 1000 mL

## 2016-07-11 MED ORDER — LEVOTHYROXINE SODIUM 112 MCG PO TABS: 112.0000 ug | ORAL_TABLET | Freq: Every day | ORAL | Status: DC

## 2016-07-11 MED ORDER — LACTATED RINGERS IV SOLN
INTRAVENOUS | Status: DC
  Administered 2016-07-11 (×2): via INTRAVENOUS

## 2016-07-11 MED ORDER — BUPIVACAINE HCL (PF) 0.25 % IJ SOLN
INTRAMUSCULAR | Status: DC | PRN
  Administered 2016-07-11: 30 mL

## 2016-07-11 MED ORDER — HYDRALAZINE HCL 20 MG/ML IJ SOLN: 10.0000 mg | INTRAMUSCULAR | Status: DC | PRN

## 2016-07-11 MED ORDER — ESCITALOPRAM OXALATE 10 MG PO TABS: 20.0000 mg | ORAL_TABLET | Freq: Every evening | ORAL | Status: DC

## 2016-07-11 SURGICAL SUPPLY — 33 items
APPLIER CLIP ROT 10 11.4 M/L (STAPLE) ×3
CABLE HIGH FREQUENCY MONO STRZ (ELECTRODE) ×3 IMPLANT
CHLORAPREP W/TINT 26ML (MISCELLANEOUS) ×3 IMPLANT
CLIP APPLIE ROT 10 11.4 M/L (STAPLE) ×1 IMPLANT
COVER MAYO STAND STRL (DRAPES) IMPLANT
COVER SURGICAL LIGHT HANDLE (MISCELLANEOUS) ×3 IMPLANT
DECANTER SPIKE VIAL GLASS SM (MISCELLANEOUS) IMPLANT
DERMABOND ADVANCED (GAUZE/BANDAGES/DRESSINGS) ×2
DERMABOND ADVANCED .7 DNX12 (GAUZE/BANDAGES/DRESSINGS) ×1 IMPLANT
DRAPE C-ARM 42X120 X-RAY (DRAPES) IMPLANT
ELECT REM PT RETURN 15FT ADLT (MISCELLANEOUS) ×3 IMPLANT
GLOVE BIO SURGEON STRL SZ 6 (GLOVE) ×3 IMPLANT
GLOVE INDICATOR 6.5 STRL GRN (GLOVE) ×3 IMPLANT
GOWN STRL REUS W/TWL LRG LVL3 (GOWN DISPOSABLE) ×3 IMPLANT
GOWN STRL REUS W/TWL XL LVL3 (GOWN DISPOSABLE) ×6 IMPLANT
GRASPER SUT TROCAR 14GX15 (MISCELLANEOUS) IMPLANT
HEMOSTAT SNOW SURGICEL 2X4 (HEMOSTASIS) IMPLANT
IRRIG SUCT STRYKERFLOW 2 WTIP (MISCELLANEOUS)
IRRIGATION SUCT STRKRFLW 2 WTP (MISCELLANEOUS) IMPLANT
KIT BASIN OR (CUSTOM PROCEDURE TRAY) ×3 IMPLANT
NEEDLE INSUFFLATION 14GA 120MM (NEEDLE) ×3 IMPLANT
POUCH SPECIMEN RETRIEVAL 10MM (ENDOMECHANICALS) ×3 IMPLANT
SCISSORS LAP 5X35 DISP (ENDOMECHANICALS) ×3 IMPLANT
SET CHOLANGIOGRAPH MIX (MISCELLANEOUS) IMPLANT
SET IRRIG TUBING LAPAROSCOPIC (IRRIGATION / IRRIGATOR) ×3 IMPLANT
SLEEVE XCEL OPT CAN 5 100 (ENDOMECHANICALS) ×6 IMPLANT
SUT MNCRL AB 4-0 PS2 18 (SUTURE) ×3 IMPLANT
TOWEL OR 17X26 10 PK STRL BLUE (TOWEL DISPOSABLE) ×3 IMPLANT
TOWEL OR NON WOVEN STRL DISP B (DISPOSABLE) IMPLANT
TRAY LAPAROSCOPIC (CUSTOM PROCEDURE TRAY) ×3 IMPLANT
TROCAR BLADELESS OPT 5 100 (ENDOMECHANICALS) ×3 IMPLANT
TROCAR XCEL 12X100 BLDLESS (ENDOMECHANICALS) ×3 IMPLANT
TUBING INSUF HEATED (TUBING) ×3 IMPLANT

## 2016-07-11 NOTE — Transfer of Care (Signed)
Immediate Anesthesia Transfer of Care Note  Patient: Sherry Cameron  Procedure(s) Performed: Procedure(s): LAPAROSCOPIC CHOLECYSTECTOMY (N/A)  Patient Location: PACU  Anesthesia Type:General  Level of Consciousness: awake and alert   Airway & Oxygen Therapy: Patient Spontanous Breathing and Patient connected to face mask oxygen  Post-op Assessment: Report given to RN and Post -op Vital signs reviewed and stable  Post vital signs: Reviewed and stable  Last Vitals:  Vitals:   07/11/16 0638 07/11/16 0908  BP: (!) 150/86 (!) 144/86  Pulse: 68 70  Resp: 14 16  Temp: 36.6 C     Last Pain:  Vitals:   07/11/16 0738  TempSrc:   PainSc: 2          Complications: No apparent anesthesia complications

## 2016-07-11 NOTE — Discharge Instructions (Signed)

## 2016-07-11 NOTE — ED Notes (Signed)
Pt transported to OR

## 2016-07-11 NOTE — Anesthesia Procedure Notes (Signed)
Procedure Name: Intubation Date/Time: 07/11/2016 12:38 PM Performed by: Danley Danker L Patient Re-evaluated:Patient Re-evaluated prior to inductionOxygen Delivery Method: Circle system utilized Preoxygenation: Pre-oxygenation with 100% oxygen Intubation Type: IV induction Ventilation: Mask ventilation without difficulty and Oral airway inserted - appropriate to patient size Laryngoscope Size: Sabra Heck and 2 Grade View: Grade III Tube type: Oral Tube size: 7.5 mm Number of attempts: 2 Airway Equipment and Method: Stylet Placement Confirmation: ETT inserted through vocal cords under direct vision,  positive ETCO2 and breath sounds checked- equal and bilateral Secured at: 21 cm Dental Injury: Teeth and Oropharynx as per pre-operative assessment

## 2016-07-11 NOTE — Progress Notes (Signed)
RN reviewed discharge instructions with patient and family. All questions answered.   Paperwork and prescriptions given.   NT rolled patient down with all belongings to family car. 

## 2016-07-11 NOTE — ED Provider Notes (Signed)
[place] [facility] DEPT Provider Note   CSN: [id number] Arrival date & time: 07/11/16  9509     History   Chief Complaint Chief Complaint  Patient presents with  . Abdominal Pain    HPI Sherry Cameron is a 51 y.o. female who presents to the Emergency Department with RUQ abdominal pain that began at approximately 12:30 AM this morning. She describes the pain as a severe, constant contraction. She reports associated chills, nausea, and loose, gray stools. No vomitting, pruritus, or shoulder pain. She treated the pain with Tramadol x2 (last dose at 3 AM) and nausea with compazine.   She reports a h/o of similar episodes of pain over the last few weeks that will last as long at 18 hours and begin approximately 4 hours after eating. She reports that she was scheduled for an [place] of RUQ at 08:15 this morning and was started on Tramadol on 5/23 by Dr. Loletha Carrow at [facility] for possible gallbadder attacks.  An upper endoscopy on 5/7. No H. Pylori or celiac sprue and was started on compazine. She reports a HIDA scan was performed in [place] 2 years ago, but she does not have the results.   Surgical hx includes tubal ligation. She is a current, every day smoker. She is allergic to Augmentin and iodinated contrast media.   The history is provided by the patient. No language interpreter was used.    Past Medical History:  Diagnosis Date  . Belching   . Hyperlipidemia   . Hypertension   . Thyroid disease     Patient Active Problem List   Diagnosis Date Noted  . Symptomatic cholelithiasis 07/11/2016  . Neck pain on left side 09/14/2013  . Vertigo 08/18/2013  . Double vision 08/18/2013  . Migraine     Past Surgical History:  Procedure Laterality Date  . TONSILLECTOMY AND ADENOIDECTOMY    . TUBAL LIGATION      OB History    No data available     Home Medications    Prior to Admission medications   Medication Sig Start Date End Date Taking? Authorizing Provider  CARTIA XT 240 MG 24 hr  capsule Take 240 mg by mouth every evening.    Yes [provider]  clonazePAM (KLONOPIN) 0.5 MG tablet Take 0.5 mg by mouth 2 (two) times daily as needed for anxiety.   Yes [provider]  escitalopram (LEXAPRO) 20 MG tablet Take 20 mg by mouth every evening.    Yes [provider]  ibuprofen (ADVIL,MOTRIN) 800 MG tablet Take 800 mg by mouth every 8 (eight) hours as needed for mild pain or moderate pain.    Yes [provider]  levothyroxine (SYNTHROID, LEVOTHROID) 112 MCG tablet Take 112 mcg by mouth daily before breakfast.   Yes [provider]  omeprazole (PRILOSEC) 20 MG capsule Take 20 mg by mouth every evening.    Yes [provider]  ondansetron (ZOFRAN) 4 MG tablet Take 1 tablet (4 mg total) by mouth every 8 (eight) hours as needed for nausea or vomiting. 05/02/16  Yes Danis, Kirke Corin, MD  potassium chloride SA (K-DUR,KLOR-CON) 20 MEQ tablet Take 20 mEq by mouth every evening.   Yes [provider]  prochlorperazine (COMPAZINE) 10 MG tablet Take 1 tablet (10 mg total) by mouth at bedtime as needed for nausea or vomiting. 06/26/16  Yes Armbruster, Renelda Loma, MD  oxyCODONE (OXY IR/ROXICODONE) 5 MG immediate release tablet Take 1-2 tablets (5-10 mg total) by mouth every  4 (four) hours as needed for moderate pain. 07/11/16   Jerrye Beavers, PA-C    Family History Family History  Problem Relation Age of Onset  . Dementia Mother   . Stroke Mother   . Cancer - Lung Father   . Esophageal cancer Father        felt secondary to lung cancer  . Heart disease Brother        felt related to drug use  . Diabetes Brother   . Colon cancer Neg Hx   . Colon polyps Neg Hx     Social History Social History  Substance Use Topics  . Smoking status: Current Every Day Smoker  . Smokeless tobacco: Never Used  . Alcohol use No     Allergies   Contrast media [iodinated diagnostic agents] and Augmentin [amoxicillin-pot  clavulanate]   Review of Systems Review of Systems  Constitutional: Positive for chills. Negative for activity change.  Respiratory: Negative for shortness of breath.   Cardiovascular: Negative for chest pain.  Gastrointestinal: Positive for abdominal pain and nausea. Negative for constipation, diarrhea and vomiting.  Genitourinary: Negative for dysuria.  Musculoskeletal: Negative for back pain.  Skin: Negative for rash.  Allergic/Immunologic: Negative for immunocompromised state.  Neurological: Positive for headaches (chronic).  Psychiatric/Behavioral: Negative for confusion.   Physical Exam Updated Vital Signs BP 132/80 (BP Location: Right Arm)   Pulse 65   Temp 97.8 F (36.6 C) (Oral)   Resp 15   Ht 5\' 2"  (1.575 m)   Wt 73.5 kg (162 lb)   LMP 05/31/2015   SpO2 98%   BMI 29.63 kg/m   Physical Exam  Constitutional: She appears well-developed and well-nourished. No distress.  Uncomfortable appearing.   HENT:  Head: Normocephalic and atraumatic.  Eyes: Conjunctivae are normal.  Neck: Neck supple.  Cardiovascular: Normal rate and regular rhythm.  Exam reveals no gallop and no friction rub.   No murmur heard. Pulmonary/Chest: Effort normal and breath sounds normal. No respiratory distress. She has no wheezes. She has no rales.  Abdominal: Soft. She exhibits no distension. Bowel sounds are increased. There is tenderness in the right upper quadrant and epigastric area. There is positive Murphy's sign. There is no guarding and no CVA tenderness.  Musculoskeletal: She exhibits no edema.  Neurological: She is alert.  Skin: Skin is warm and dry. No rash noted. She is not diaphoretic.  Psychiatric: Her behavior is normal.  Nursing note and vitals reviewed.  ED Treatments / Results  Labs (all labs ordered are listed, but only abnormal results are displayed) Labs Reviewed  COMPREHENSIVE METABOLIC PANEL - Abnormal; Notable for the following:       Result Value   Glucose, Bld  124 (*)    All other components within normal limits  CBC - Abnormal; Notable for the following:    WBC 10.9 (*)    MCH 25.3 (*)    All other components within normal limits  LIPASE, BLOOD  SURGICAL PATHOLOGY    EKG  EKG Interpretation None       Radiology No results found.  Procedures Procedures (including critical care time)  Medications Ordered in ED Medications  HYDROmorphone (DILAUDID) injection 1 mg (1 mg Intravenous Given 07/11/16 0709)  promethazine (PHENERGAN) injection 25 mg (25 mg Intravenous Given 07/11/16 0904)  sodium chloride 0.9 % bolus 500 mL (0 mLs Intravenous Stopped 07/11/16 0950)  ciprofloxacin (CIPRO) 400 MG/200ML IVPB (  Override pull for Anesthesia 07/11/16 1245)     Initial  Impression / Assessment and Plan / ED Course  I have reviewed the triage vital signs and the nursing notes.  Pertinent labs & imaging results that were available during my care of the patient were reviewed by me and considered in my medical decision making (see chart for details).     51 year old female with more frequent, worsening episodes of biliary colic. She has been able to control her pain with Advil and most recently Tramadol prescribed by Dr. Loletha Carrow in GI, but presented to the ED after she was unable to control her pain at home. VSS stabile; afebrile, non-tachy. Slight leukocytosis at 10.9. No hyperbilirubinemia or elevated transaminases. RUQ US demonstrating multiple gallstones measuring up to 1.3 cm in diameter. No thickening of the gall bladder wall. Common bile duct measuring 7.6 mm. Patient reports she lives a considerable distance from [place] and has been making multiple trips for imaging, GI visits, and today's ED visit. Discussed and evaluated the patient with Dr. Regenia Skeeter, attending physician. Discussed sending the patient home with medication for main control until she could follow-up with GI; however she is insistent on discussing surgical options. Consulted  general surgery who assessed the patient and will her to med-surg with a possible lab chole IOC later today. Cipro initiated and will continue NPO.   Final Clinical Impressions(s) / ED Diagnoses   Final diagnoses:  RUQ abdominal pain    New Prescriptions      Joanne Gavel, PA-C 07/14/16 1612    Sherwood Gambler, MD 07/22/16 1452

## 2016-07-11 NOTE — Discharge Summary (Signed)
[facility] Discharge Summary   Patient ID: Sherry Cameron MRN: [id number] DOB/AGE: [birth year] 51 y.o.  Admit date: 07/11/2016 Discharge date: 07/11/2016  Admitting Diagnosis: Symptomatic cholelithiasis  Discharge Diagnosis Patient Active Problem List   Diagnosis Date Noted  . Symptomatic cholelithiasis 07/11/2016  . Neck pain on left side 09/14/2013  . Vertigo 08/18/2013  . Double vision 08/18/2013  . Migraine     Consultants None  Imaging: US Abdomen Limited Ruq  Result Date: 07/11/2016 CLINICAL DATA:  Right upper quadrant pain EXAM: US ABDOMEN LIMITED - RIGHT UPPER QUADRANT COMPARISON:  None. FINDINGS: Gallbladder: Multiple gallstones measuring up to 1.3 cm in diameter. No gallbladder wall thickening. Negative sonographic Murphy sign Common bile duct: Diameter: 7.6 mm, upper normal Liver: No focal lesion identified. Within normal limits in parenchymal echogenicity. IMPRESSION: Cholelithiasis without evidence of acute cholecystitis. Common bile duct upper normal 7.6 mm in diameter. Electronically Signed   By: Franchot Gallo M.D.   On: 07/11/2016 09:04    Procedures Dr. Kae Heller (07/11/16) - Laparoscopic Cholecystectomy  Hospital Course:  Sherry Cameron is a 51yo female who presented to [facility]/25 with 2 weeks of worsening epigastric abdominal pain and nausea. Patient has had intermittent biliary colic for 3 years. She had an upper endoscopy that was negative.  Workup this admission showed cholelithiasis with stones up to 1.3cm in diameter.  Patient was admitted and underwent procedure listed above.  Tolerated procedure well and was transferred to the floor.  Diet was advanced as tolerated.  On POD0 the patient was voiding well, tolerating diet, ambulating well, pain well controlled, vital signs stable, incisions c/d/i and felt stable for discharge home.  Patient will follow up in our office in 3 weeks and knows to call with questions or concerns.   I have personally  reviewed the patients medication history on the [place] controlled substance database.    Physical Exam: General:  Alert, NAD, pleasant, comfortable Pulm: effort normal Abd:  Soft, ND, appropriately tender, multiple lap incisions C/D/I  Allergies as of 07/11/2016      Reactions   Contrast Media [iodinated Diagnostic Agents] Anaphylaxis, Hives, Itching   Augmentin [amoxicillin-pot Clavulanate] Other (See Comments)   Reaction:  Unknown  Has patient had a PCN reaction causing immediate rash, facial/tongue/throat swelling, SOB or lightheadedness with hypotension: Unknown Has patient had a PCN reaction causing severe rash involving mucus membranes or skin necrosis: Unknown Has patient had a PCN reaction that required hospitalization: Unknown Has patient had a PCN reaction occurring within the last 10 years: No If all of the above answers are "NO", then may proceed with Cephalosporin use.      Medication List    STOP taking these medications   traMADol 50 MG tablet Commonly known as:  ULTRAM     TAKE these medications   CARTIA XT 240 MG 24 hr capsule Generic drug:  diltiazem Take 240 mg by mouth every evening.   clonazePAM 0.5 MG tablet Commonly known as:  KLONOPIN Take 0.5 mg by mouth 2 (two) times daily as needed for anxiety.   escitalopram 20 MG tablet Commonly known as:  LEXAPRO Take 20 mg by mouth every evening.   ibuprofen 800 MG tablet Commonly known as:  ADVIL,MOTRIN Take 800 mg by mouth every 8 (eight) hours as needed for mild pain or moderate pain.   levothyroxine 112 MCG tablet Commonly known as:  SYNTHROID, LEVOTHROID Take 112 mcg by mouth daily before breakfast.   omeprazole 20 MG capsule Commonly  known as:  PRILOSEC Take 20 mg by mouth every evening.   ondansetron 4 MG tablet Commonly known as:  ZOFRAN Take 1 tablet (4 mg total) by mouth every 8 (eight) hours as needed for nausea or vomiting.   oxyCODONE 5 MG immediate release tablet Commonly known as:  Oxy  IR/ROXICODONE Take 1-2 tablets (5-10 mg total) by mouth every 4 (four) hours as needed for moderate pain.   potassium chloride SA 20 MEQ tablet Commonly known as:  K-DUR,KLOR-CON Take 20 mEq by mouth every evening.   prochlorperazine 10 MG tablet Commonly known as:  COMPAZINE Take 1 tablet (10 mg total) by mouth at bedtime as needed for nausea or vomiting.        Follow-up Information    Clovis Riley, MD. Go on 07/30/2016.   Specialty:  General Surgery Why:  Your appointment is 07/30/16 at 4:30PM. Please arrive 30 minutes prior to your appointment to check in and fill out necessary paperwork. Contact information: [phone]           Signed: Jerrye Beavers, [facility] 07/11/2016, 4:20 PM Pager: [phone] Consults: [phone] Mon-Fri 7:00 am-4:30 pm Sat-Sun 7:00 am-11:30 am

## 2016-07-11 NOTE — ED Triage Notes (Signed)
Pt reports having pain near gallbladder that started at 0130 this morning. Pt reports having appt for ultrasound of gallbladder at 0815 today. Pt reports increasing pain and nausea today.

## 2016-07-11 NOTE — ED Notes (Signed)
ED Provider at bedside. 

## 2016-07-11 NOTE — Anesthesia Postprocedure Evaluation (Signed)
Anesthesia Post Note  Patient: Sherry Cameron  Procedure(s) Performed: Procedure(s) (LRB): LAPAROSCOPIC CHOLECYSTECTOMY (N/A)  Patient location during evaluation: PACU Anesthesia Type: General Level of consciousness: awake and alert Pain management: pain level controlled Vital Signs Assessment: post-procedure vital signs reviewed and stable Respiratory status: spontaneous breathing, nonlabored ventilation, respiratory function stable and patient connected to nasal cannula oxygen Cardiovascular status: blood pressure returned to baseline and stable Postop Assessment: no signs of nausea or vomiting Anesthetic complications: no       Last Vitals:  Vitals:   07/11/16 1422 07/11/16 1442  BP: (!) 153/98 (!) 148/83  Pulse: 78 70  Resp: (!) 21 15  Temp: 36.9 C 36.4 C    Last Pain:  Vitals:   07/11/16 1442  TempSrc:   PainSc: 0-No pain                 Aesha Agrawal P T[place]on Laura

## 2016-07-11 NOTE — ED Notes (Signed)
US at bedside

## 2016-07-11 NOTE — H&P (Signed)
[name]  [name] 1965/03/08  [id number].    Requesting MD: Regenia Skeeter Chief Complaint/Reason for Consult: RUQ pain  HPI:  Sherry Cameron is a 51yo female who presented to [facility] earlier this morning with acute onset of epigastric pain around midnight. This was associated with nausea and diarrhea, no emesis. Patient reports several similar episodes over the last 3 years. Typically Advil helps. She has had 4 episodes in the last 2 weeks. States that they typically occur after she eats fatty foods, but this time it occurred after eating a salad. States that the pain is constant and severe.  Denies fever or chills. Patient is followed by Dr. Loletha Carrow who performed an upper endoscopy 2 weeks ago; there were no acute findings. She was scheduled for an outpatient u/s today, but could not wait because of the pain. Today in the ED her u/s showed cholelithiasis (multiple gallstones measuring up to 1.3 cm) without evidence of acute cholecystitis, Negative sonographic Murphy sign, CBD 7.38m diameter. Her LFTs are WNL, WBC 10.9, afebrile. Patient would like to discuss surgery. Last meal yesterday. Last BM yesterday.  PMH significant for HTN, Depression/anxiety Abdominal surgical history: tubal ligation Anticoagulants: none Smokes 1 PPD Employment: works in sTeacher, adult education ROS: Review of Systems  Constitutional: Negative.   HENT: Negative.   Eyes: Negative.   Respiratory: Negative.   Cardiovascular: Negative.   Gastrointestinal: Positive for abdominal pain, diarrhea and nausea. Negative for blood in stool, constipation, heartburn, melena and vomiting.  Genitourinary: Negative.   Musculoskeletal: Negative.   Skin: Negative.   Neurological: Negative.   All systems reviewed and otherwise negative except for as above  Family History  Problem Relation Age of Onset  . Dementia Mother   . Stroke Mother   . Cancer - Lung Father   . Esophageal  cancer Father        felt secondary to lung cancer  . Heart disease Brother        felt related to drug use  . Diabetes Brother   . Colon cancer Neg Hx   . Colon polyps Neg Hx     Past Medical History:  Diagnosis Date  . Belching   . Hyperlipidemia   . Hypertension   . Thyroid disease     Past Surgical History:  Procedure Laterality Date  . TONSILLECTOMY AND ADENOIDECTOMY    . TUBAL LIGATION      Social History:  reports that she has been smoking.  She has never used smokeless tobacco. She reports that she does not drink alcohol or use drugs.  Allergies:  Allergies  Allergen Reactions  . Contrast Media [Iodinated Diagnostic Agents] Anaphylaxis, Hives and Itching  . Augmentin [Amoxicillin-Pot Clavulanate] Other (See Comments)    Reaction:  Unknown  Has patient had a PCN reaction causing immediate rash, facial/tongue/throat swelling, SOB or lightheadedness with hypotension: Unknown Has patient had a PCN reaction causing severe rash involving mucus membranes or skin necrosis: Unknown Has patient had a PCN reaction that required hospitalization: Unknown Has patient had a PCN reaction occurring within the last 10 years: No If all of the above answers are "NO", then may proceed with Cephalosporin use.     (Not in a hospital admission)  Prior to Admission medications   Medication Sig Start Date End Date Taking? Authorizing Provider  CARTIA XT 240 MG 24 hr capsule Take 240 mg by mouth every evening.    Yes [provider]  clonazePAM (Bobbye Charleston  0.5 MG tablet Take 0.5 mg by mouth 2 (two) times daily as needed for anxiety.   Yes [provider]  escitalopram (LEXAPRO) 20 MG tablet Take 20 mg by mouth every evening.    Yes [provider]  ibuprofen (ADVIL,MOTRIN) 800 MG tablet Take 800 mg by mouth every 8 (eight) hours as needed for mild pain or moderate pain.    Yes [provider]  levothyroxine (SYNTHROID, LEVOTHROID) 112 MCG tablet Take 112  mcg by mouth daily before breakfast.   Yes [provider]  omeprazole (PRILOSEC) 20 MG capsule Take 20 mg by mouth every evening.    Yes [provider]  ondansetron (ZOFRAN) 4 MG tablet Take 1 tablet (4 mg total) by mouth every 8 (eight) hours as needed for nausea or vomiting. 05/02/16  Yes Danis, Kirke Corin, MD  potassium chloride SA (K-DUR,KLOR-CON) 20 MEQ tablet Take 20 mEq by mouth every evening.   Yes [provider]  prochlorperazine (COMPAZINE) 10 MG tablet Take 1 tablet (10 mg total) by mouth at bedtime as needed for nausea or vomiting. 06/26/16  Yes Armbruster, Renelda Loma, MD  traMADol (ULTRAM) 50 MG tablet Take 1 tablet (50 mg total) by mouth every 8 (eight) hours as needed. Patient taking differently: Take 50 mg by mouth every 8 (eight) hours as needed for moderate pain.  07/09/16  Yes Danis, Estill Cotta III, MD    Blood pressure (!) 144/86, pulse 70, temperature 97.9 F (36.6 C), temperature source Oral, resp. rate 16, height '5\' 2"'$  (1.575 m), weight 162 lb (73.5 kg), last menstrual period 05/31/2015, SpO2 97 %. Physical Exam: General: pleasant, WD/WN white female who is laying in bed in NAD HEENT: head is normocephalic, atraumatic.  Sclera are noninjected.  Pupils equal and round.  Ears and nose without any masses or lesions.  Mouth is pink and moist. Dentition fair Heart: regular, rate, and rhythm.  No obvious murmurs, gallops, or rubs noted.  Palpable pedal pulses bilaterally Lungs: CTAB, no wheezes, rhonchi, or rales noted.  Respiratory effort nonlabored Abd: soft, ND, +BS, no masses, hernias, or organomegaly. Mild RUQ tenderness. Negative Murphy sign. MS: all 4 extremities are symmetrical with no cyanosis, clubbing, or edema. Skin: warm and dry with no masses, lesions, or rashes Psych: A&Ox3 with an appropriate affect. Neuro: cranial nerves grossly intact, extremity CSM intact bilaterally, normal speech  Results for orders placed or performed during the  hospital encounter of 07/11/16 (from the past 48 hour(s))  Lipase, blood     Status: None   Collection Time: 07/11/16  6:41 AM  Result Value Ref Range   Lipase 26 11 - 51 U/L  Comprehensive metabolic panel     Status: Abnormal   Collection Time: 07/11/16  6:41 AM  Result Value Ref Range   Sodium 138 135 - 145 mmol/L   Potassium 3.6 3.5 - 5.1 mmol/L   Chloride 103 101 - 111 mmol/L   CO2 25 22 - 32 mmol/L   Glucose, Bld 124 (H) 65 - 99 mg/dL   BUN 15 6 - 20 mg/dL   Creatinine, Ser 1.00 0.44 - 1.00 mg/dL   Calcium 9.5 8.9 - 10.3 mg/dL   Total Protein 8.0 6.5 - 8.1 g/dL   Albumin 4.4 3.5 - 5.0 g/dL   AST 20 15 - 41 U/L   ALT 16 14 - 54 U/L   Alkaline Phosphatase 119 38 - 126 U/L   Total Bilirubin 0.4 0.3 - 1.2 mg/dL  GFR calc non Af Amer >60 >60 mL/min   GFR calc Af Amer >60 >60 mL/min    Comment: (NOTE) The eGFR has been calculated using the CKD EPI equation. This calculation has not been validated in all clinical situations. eGFR's persistently <60 mL/min signify possible Chronic Kidney Disease.    Anion gap 10 5 - 15  CBC     Status: Abnormal   Collection Time: 07/11/16  6:41 AM  Result Value Ref Range   WBC 10.9 (H) 4.0 - 10.5 K/uL   RBC 4.75 3.87 - 5.11 MIL/uL   Hemoglobin 12.0 12.0 - 15.0 g/dL   HCT 37.9 36.0 - 46.0 %   MCV 79.8 78.0 - 100.0 fL   MCH 25.3 (L) 26.0 - 34.0 pg   MCHC 31.7 30.0 - 36.0 g/dL   RDW 15.4 11.5 - 15.5 %   Platelets 377 150 - 400 K/uL   US Abdomen Limited Ruq  Result Date: 07/11/2016 CLINICAL DATA:  Right upper quadrant pain EXAM: US ABDOMEN LIMITED - RIGHT UPPER QUADRANT COMPARISON:  None. FINDINGS: Gallbladder: Multiple gallstones measuring up to 1.3 cm in diameter. No gallbladder wall thickening. Negative sonographic Murphy sign Common bile duct: Diameter: 7.6 mm, upper normal Liver: No focal lesion identified. Within normal limits in parenchymal echogenicity. IMPRESSION: Cholelithiasis without evidence of acute cholecystitis. Common bile  duct upper normal 7.6 mm in diameter. Electronically Signed   By: Franchot Gallo M.D.   On: 07/11/2016 09:04      Assessment/Plan Symptomatic cholelithiasis - abdominal surgical history: tubal ligation - biliary colic symptoms x3 years, with 4 episodes in the last 2 weeks - RUQ u/s shows cholelithiasis (Multiple gallstones measuring up to 1.3 cm) without evidence of acute cholecystitis, Negative sonographic Murphy sign, CBD 7.31m diameter - LFTs WNL, WBC 10.9  HTN Depression/anxiety Tobacco abuse  ID - cipro  VTE - SCDs, hold chemical DVT prophylaxis for procedure FEN - IVF, NPO  Plan - Admit to med-surg. Plan for lap chole with possible IOC later today. Discussed risks/benefits of surgery and patient wishes to proceed. Continue NPO and start cipro.  BJerrye Beavers [facility] 07/11/2016, 10:51 AM Pager: [phone]: [phone]-Fri 7:00 am-4:30 pm Sat-Sun 7:00 am-11:30 am

## 2016-07-11 NOTE — Op Note (Signed)
Operative Note  Sherry Cameron 51 y.o. female [id number]  07/11/2016  Surgeon: Clovis Riley   Assistant: OR staff  Procedure performed: Laparoscopic Cholecystectomy  Preop diagnosis: escalating biliary colic Post-op diagnosis/intraop findings: acute on chronic cholecystitis with hydrops of the gallbladder  Specimens: gallbladder  EBL: minimal  Complications: none  Description of procedure: After obtaining informed consent the patient was brought to the operating room. Prophylactic antibiotics and subcutaneous heparin were administered. SCD's were applied. General endotracheal anesthesia was initiated and a formal time-out was performed. The abdomen was prepped and draped in the usual sterile fashion and the abdomen was entered using an infraumbilical veress needle after instilling the site with local. Insufflation to 46mmHg was obtained, 50mm trocar and camera placed and gross inspection revealed no evidence of injury from our entry or other intraabdominal abnormalities. Two 34mm trocars were introduced in the right midclavicular and right anterior axillary lines under direct visualization and following infiltration with local. A 84mm trocar was placed in the epigastrium. The gallbladder was dilated and firm, this was aspirated with a nezhat to facilitate and clear fluid was retrieved from the gallbladder. Omental adhesions to the dome and fundus were carefully taken down with cautery and blunt dissection. The gallbladder fundus was retracted cephalad and the infundibulum was retracted laterally. A combination of hook electrocautery and blunt dissection was utilized to clear the peritoneum from the neck and cystic duct, circumferentially isolating the cystic artery and cystic duct and lifting the gallbladder from the cystic plate. The critical view of safety was achieved with the cystic artery, cystic duct, and liver bed visualized between them with no other structures. The artery was clipped  with a single clip proximally and distally and divided as was the cystic duct with two clips on the proximal end. The gallbladder was dissected from the liver plate using electrocautery. Once freed the gallbladder was placed in an endocatch bag and removed intact through the epigastric trocar site. The right upper quadrant was irrigated and the effluent was clear. Hemostasis was once again confirmed, and reinspection of the abdomen revealed no injuries. The clips were well opposed without any bile leak from the duct or the liver bed. The 88mm trocar site in the epigastrium was closed with a 0 vicryl in the fascia under direct visualization using a PMI device. The abdomen was desufflated and all trocars removed. The skin incisions were closed with running subcuticular monocryl and Dermabond. The patient was awakened, extubated and transported to the recovery room in stable condition.   All counts were correct at the completion of the case.

## 2016-07-11 NOTE — Anesthesia Preprocedure Evaluation (Addendum)
Anesthesia Evaluation  Patient identified by MRN, date of birth, ID band Patient awake    Reviewed: Allergy & Precautions, NPO status , Patient's Chart, lab work & pertinent test results  Airway Mallampati: II  TM Distance: >3 FB Neck ROM: Full    Dental no notable dental hx.    Pulmonary Current Smoker (1 ppd),    Pulmonary exam normal breath sounds clear to auscultation       Cardiovascular Exercise Tolerance: Good hypertension, Pt. on medications Normal cardiovascular exam Rhythm:Regular Rate:Normal     Neuro/Psych Anxiety Depression negative neurological ROS     GI/Hepatic negative GI ROS, Neg liver ROS,   Endo/Other  Hypothyroidism   Renal/GU negative Renal ROS  negative genitourinary   Musculoskeletal negative musculoskeletal ROS (+)   Abdominal   Peds negative pediatric ROS (+)  Hematology negative hematology ROS (+)   Anesthesia Other Findings Hyperlipidemia  Reproductive/Obstetrics negative OB ROS                            Anesthesia Physical Anesthesia Plan  ASA: III  Anesthesia Plan: General   Post-op Pain Management:    Induction: Intravenous  Airway Management Planned: Oral ETT  Additional Equipment:   Intra-op Plan:   Post-operative Plan: Extubation in OR  Informed Consent: I have reviewed the patients History and Physical, chart, labs and discussed the procedure including the risks, benefits and alternatives for the proposed anesthesia with the patient or authorized representative who has indicated his/her understanding and acceptance.   Dental advisory given  Plan Discussed with: CRNA and Surgeon  Anesthesia Plan Comments:         Anesthesia Quick Evaluation

## 2016-07-15 ENCOUNTER — Encounter (HOSPITAL_COMMUNITY): Payer: Self-pay | Admitting: Surgery

## 2016-07-28 ENCOUNTER — Telehealth: Payer: Self-pay | Admitting: Gastroenterology

## 2016-07-28 NOTE — Telephone Encounter (Signed)
Patient had her gallbladder removed and since then she has had more reflux. She started taking the omeprazole at bedtime. She continues to have reflux, she was wondering if this is related to having her gallbladder removed? General surgeon told her it was not related to that. She no longer is having the nausea, since having cholecystectomy. Please advise, patient is aware that you will be back into office tomorrow.

## 2016-07-29 NOTE — Telephone Encounter (Signed)
Patient advised of recommendations. She will try changing when she takes her omeprazole. Discussed diet changes and I have mailed her information on GERD diet and lifestyle. She understands to contact our office if after trying these things she is still having increase in symptoms.

## 2016-07-29 NOTE — Telephone Encounter (Signed)
Glad to hear the nausea is resolved. Removing the gallbladder does not commonly cause an increase in reflux symptoms.  Regardless, she needs typical GERD diet and lifestyle recommendations.  Omeprazole usually works better if taken at meal time rather than bedtime.  If that is not much help after a week, let us know and we can prescribe a higher dose of it.

## 2017-01-01 ENCOUNTER — Telehealth: Payer: Self-pay | Admitting: Gastroenterology

## 2017-01-01 NOTE — Telephone Encounter (Signed)
Patient is having nausea once again and RUQ pain. Pain so severe she describes it as she "almost passed out". Patient is not taking her omeprazole any longer. She will restart taking that, patient also had cholecystectomy at end of May.

## 2017-01-02 ENCOUNTER — Other Ambulatory Visit: Payer: Self-pay

## 2017-01-02 ENCOUNTER — Other Ambulatory Visit: Payer: Self-pay | Admitting: Gastroenterology

## 2017-01-02 MED ORDER — HYOSCYAMINE SULFATE 0.125 MG SL SUBL: 0.1250 mg | SUBLINGUAL_TABLET | Freq: Four times a day (QID) | SUBLINGUAL | 1 refills | Status: DC | PRN

## 2017-01-02 NOTE — Telephone Encounter (Signed)
Patient advised, Rx sent to her pharmacy. She would like to have labs done at [facility] in [place], [place]. I have faxed order for LFT's to: [phone].

## 2017-01-02 NOTE — Telephone Encounter (Signed)
Sorry to hear she is not feeling well. I read the operative report.  Please get LFTs.  If elevated, that might suggest that there is a reatained bile duct stone.  I do not think she needs to resume the omeprazole.  Could be intestinal spasm.  Prescribe hyoscyamine 0.125 mg,  Sig: one tablet every 6 hours as needed for abdominal cramps.  Disp: 20, RF 1

## 2017-01-06 ENCOUNTER — Telehealth: Payer: Self-pay | Admitting: Gastroenterology

## 2017-01-06 ENCOUNTER — Other Ambulatory Visit: Payer: Self-pay

## 2017-01-06 DIAGNOSIS — R945 Abnormal results of liver function studies: Secondary | ICD-10-CM

## 2017-01-06 LAB — HEPATIC FUNCTION PANEL
ALBUMIN: 4.5 g/dL (ref 3.5–5.5)
ALT: 14 IU/L (ref 0–32)
AST: 17 IU/L (ref 0–40)
Alkaline Phosphatase: 126 IU/L — ABNORMAL HIGH (ref 39–117)
BILIRUBIN, DIRECT: 0.04 mg/dL (ref 0.00–0.40)
Bilirubin Total: <0.2 mg/dL (ref 0.0–1.2)
TOTAL PROTEIN: 7.2 g/dL (ref 6.0–8.5)

## 2017-01-06 LAB — SPECIMEN STATUS REPORT

## 2017-01-06 NOTE — Telephone Encounter (Signed)
Slight elevation in one of her liver labs  (for documentation purposes: AST 17, ALT 14, Alk Phos 126 (upper limit nml 117) , Total bilirubin 0.2  Since she is having symptoms after the GB was removed, we should make sure she does not have a stone in the bile duct.  Please schedule MRCP.  Dx:  RUQ pain, elevated LFTs  I also would like to know if the levsin has helped at all.

## 2017-01-06 NOTE — Telephone Encounter (Signed)
Patient is scheduled for MRCP on 11/28 at [facility], arrive 4:45 for 5:00 pm test, she is aware to be NPO 4 hours prior. Radiology is aware that she is allergic to iodine contrast, let patient know it is a different type of contrast used for MRI.  Patient has not tried the Levsin yet, she was afraid to try this at work. She thought this might cause diarrhea, reassured patient that medication does not have this side effect. Patient states that the pain has been tolerable last couple of days.

## 2017-01-06 NOTE — Telephone Encounter (Signed)
No contrast is used for MRCP.    MRI contrast, when used, is not iodine-based and can be used in those with CT contrast dye allergy.

## 2017-01-12 ENCOUNTER — Telehealth: Payer: Self-pay

## 2017-01-12 ENCOUNTER — Telehealth: Payer: Self-pay | Admitting: Gastroenterology

## 2017-01-12 NOTE — Telephone Encounter (Signed)
Spoke to patient, let her know that I do not know of anywhere up there to get the MRCP done, they are not the same radiologists. I also let her know that [facility] does pre-cert with the patient's insurance for these tests and if there is a problem will let us/patient know in advance. I did send Amy a message just to check to see if our facility is in network. I told patient that if there is a problem would call her back.

## 2017-01-12 NOTE — Telephone Encounter (Signed)
Called patient back and gave her pre-service center phone number.

## 2017-01-12 NOTE — Telephone Encounter (Signed)
-----   Message from Darden Dates sent at 01/12/2017  8:38 AM EST ----- She needs to call Pre-Service center at [facility] where she is scheduled. They actually should be calling her beings her appt is Wed.  They call the pt and give benefits and let them know what their responsibility is.  I only check for precerts for any imaging or any procedures scheduled at the hospital.  Their number is [phone].  Her insurance did not require a precert when I called them on 01/06/17. ----- Message ----- From: Doristine Counter, RN Sent: 01/12/2017   8:37 AM To: Darden Dates  Patient just called and wanted to know if her scheduled MRCP is "in network" for her insurance. Thank you.

## 2017-01-13 ENCOUNTER — Other Ambulatory Visit: Payer: Self-pay | Admitting: Gastroenterology

## 2017-01-13 DIAGNOSIS — R945 Abnormal results of liver function studies: Secondary | ICD-10-CM

## 2017-01-14 ENCOUNTER — Telehealth: Payer: Self-pay

## 2017-01-14 ENCOUNTER — Ambulatory Visit (HOSPITAL_COMMUNITY): Admission: RE | Admit: 2017-01-14 | Payer: PRIVATE HEALTH INSURANCE | Source: Ambulatory Visit

## 2017-01-14 NOTE — Telephone Encounter (Signed)
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 She would also need Benadryl 50 mg 1 hour prior. Patient's test has been rescheduled by hospital to Saturday, 12/1 at 5:00 pm. Please advise, thank you.

## 2017-01-14 NOTE — Telephone Encounter (Signed)
I understand that.  However, I do not want a full MRI abdomen with gadolinium contrast.  I just want a dedicated MRCP.  Last I spoke with a radiologist, the MRCP does not require any contrast.  Please confirm this with the radiology department.

## 2017-01-14 NOTE — Telephone Encounter (Signed)
Spoke to radiology, they are able to adjust order to MRCP without contrast, so patient does not need the premedication. When you put it in the system and for pre-cert, it comes up w/ and w/o contrast. Horris Latino at radiology adjusted the order to without only.

## 2017-01-17 ENCOUNTER — Ambulatory Visit (HOSPITAL_COMMUNITY)
Admission: RE | Admit: 2017-01-17 | Discharge: 2017-01-17 | Disposition: A | Discharge status: Home / Self Care | Payer: PRIVATE HEALTH INSURANCE | Source: Ambulatory Visit | Attending: Gastroenterology | Admitting: Gastroenterology

## 2017-01-17 DIAGNOSIS — R945 Abnormal results of liver function studies: Secondary | ICD-10-CM | POA: Diagnosis present

## 2017-01-17 DIAGNOSIS — Z9049 Acquired absence of other specified parts of digestive tract: Secondary | ICD-10-CM | POA: Diagnosis not present

## 2017-03-03 ENCOUNTER — Encounter: Payer: Self-pay | Admitting: Gastroenterology

## 2017-03-03 ENCOUNTER — Ambulatory Visit (INDEPENDENT_AMBULATORY_CARE_PROVIDER_SITE_OTHER): Payer: PRIVATE HEALTH INSURANCE | Admitting: Gastroenterology

## 2017-03-03 VITALS — BP 110/80 | HR 72 | Ht 62.0 in | Wt 163.2 lb

## 2017-03-03 DIAGNOSIS — R110 Nausea: Secondary | ICD-10-CM | POA: Diagnosis not present

## 2017-03-03 DIAGNOSIS — R1013 Epigastric pain: Secondary | ICD-10-CM

## 2017-03-03 DIAGNOSIS — G8929 Other chronic pain: Secondary | ICD-10-CM

## 2017-03-03 DIAGNOSIS — R140 Abdominal distension (gaseous): Secondary | ICD-10-CM

## 2017-03-03 DIAGNOSIS — R197 Diarrhea, unspecified: Secondary | ICD-10-CM | POA: Diagnosis not present

## 2017-03-03 MED ORDER — CHOLESTYRAMINE 4 G PO PACK
4.0000 g | PACK | ORAL | 1 refills | Status: DC
Start: 2017-03-03 — End: 2017-04-30

## 2017-03-03 MED ORDER — METOCLOPRAMIDE HCL 5 MG PO TABS: 5.0000 mg | ORAL_TABLET | Freq: Every day | ORAL | 0 refills | Status: DC

## 2017-03-03 NOTE — Patient Instructions (Addendum)
If you are age 54 or older, your body mass index should be between 23-30. Your Body mass index is 29.86 kg/m. If this is out of the aforementioned range listed, please consider follow up with your Primary Care Provider.  If you are age 54 or younger, your body mass index should be between 19-25. Your Body mass index is 29.86 kg/m. If this is out of the aformentioned range listed, please consider follow up with your Primary Care Provider.   Please call with an update in a few weeks. Ask for Dorsey [phone]  Food Guidelines for a sensitive stomach  Many people have difficulty digesting certain foods, causing a variety of distressing and embarrassing symptoms such as abdominal pain, bloating and gas.  These foods may need to be avoided or consumed in small amounts.  Here are some tips that might be helpful for you.  1.   Lactose intolerance is the difficulty or complete inability to digest lactose, the natural sugar in milk and anything made from milk.  This condition is harmless, common, and can begin any time during life.  Some people can digest a modest amount of lactose while others cannot tolerate any.  Also, not all dairy products contain equal amounts of lactose.  For example, hard cheeses such as parmesan have less lactose than soft cheeses such as cheddar.  Yogurt has less lactose than milk or cheese.  Many packaged foods (even many brands of bread) have milk, so read ingredient lists carefully.  It is difficult to test for lactose intolerance, so just try avoiding lactose as much as possible for a week and see what happens with your symptoms.  If you seem to be lactose intolerant, the best plan is to avoid it (but make sure you get calcium from another source).  The next best thing is to use lactase enzyme supplements, available over the counter everywhere.  Just know that many lactose intolerant people need to take several tablets with each serving of dairy to avoid symptoms.  Lastly, a lot of  restaurant food is made with milk or butter.  Many are things you might not suspect, such as mashed potatoes, rice and pasta (cooked with butter) and "grilled" items.  If you are lactose intolerant, it never hurts to ask your server what has milk or butter.  2.   Fiber is an important part of your diet, but not all fiber is well-tolerated.  Insoluble fiber such as bran is often consumed by normal gut bacteria and converted into gas.  Soluble fiber such as oats, squash, carrots and green beans are typically tolerated better.  3.   Some types of carbohydrates can be poorly digested.  Examples include: fructose (apples, cherries, pears, raisins and other dried fruits), fructans (onions, zucchini, large amounts of wheat), sorbitol/mannitol/xylitol and sucralose/Splenda (common artificial sweeteners), and raffinose (lentils, broccoli, cabbage, asparagus, brussel sprouts, many types of beans).  Do a Development worker, community for [employer] and you will find helpful information. Beano, a dietary supplement, will often help with raffinose-containing foods.  As with lactase tablets, you may need several per serving.  4.   Whenever possible, avoid processed food&meats and chemical additives.  High fructose corn syrup, a common sweetener, may be difficult to digest.  Eggs and soy (comes from the soybean, and added to many foods now) are the other most common bloating/gassy foods.  - Dr. Herma Ard [facility]

## 2017-03-03 NOTE — Progress Notes (Signed)
[facility] Progress Note  Chief Complaint: Epigastric pain and nausea  Subjective  History:  This is follow-up for a 52 year old woman who has seen me for a variety of GI symptoms.  She has had years of chronic nausea and upper abdominal bloating with intermittent epigastric and right upper quadrant pain.  EGD was unremarkable last year, and she had a previous workup in [place] years before that.  I wondered if she may have biliary colic as at least part of the symptoms, so ultrasound was scheduled.  She came to the ED during a severe episode of upper abdominal pain nausea, ultrasound showed multiple gallstones and she had cholecystectomy.  She reports having felt well for about a month after that, but then the nausea and abdominal pain returned. She contacted [place] about that a couple of months ago, check LFTs and they were normal.  MRI abdomen/MRCP done to make sure there was nothing obstructive or neoplastic or a retained stone in the common bile duct.  This was a normal study.  She continues to have intermittent episodes of severe epigastric pain that she reports is a 10 out of 10 and almost makes her pass out.  She still has upper abdominal bloating and a lot of gas.  In the morning she wakes up with frequent nausea.  She previously had alternating constipation and diarrhea, but now has usually 4-5 semi-formed BMs per day since getting her gallbladder removed.  ROS: Cardiovascular:  no chest pain Respiratory: no dyspnea Chronic anxiety as before. Remainder systems negative except as above. The patient's Past Medical, Family and Social History were reviewed and are on file in the EMR.  Objective:  Med list reviewed  Current Outpatient Medications:  .  CARTIA XT 240 MG 24 hr capsule, Take 240 mg by mouth every evening. , Disp: , Rfl:  .  clonazePAM (KLONOPIN) 0.5 MG tablet, Take 0.5 mg by mouth 2 (two) times daily as needed for anxiety., Disp: , Rfl:  .  escitalopram (LEXAPRO)  20 MG tablet, Take 20 mg by mouth every evening. , Disp: , Rfl:  .  hyoscyamine (LEVSIN SL) 0.125 MG SL tablet, Place 1 tablet (0.125 mg total) every 6 (six) hours as needed under the tongue for cramping., Disp: 20 tablet, Rfl: 1 .  ibuprofen (ADVIL,MOTRIN) 800 MG tablet, Take 800 mg by mouth every 8 (eight) hours as needed for mild pain or moderate pain. , Disp: , Rfl:  .  levothyroxine (SYNTHROID, LEVOTHROID) 112 MCG tablet, Take 112 mcg by mouth daily before breakfast., Disp: , Rfl:  .  omeprazole (PRILOSEC) 20 MG capsule, Take 20 mg by mouth every evening. , Disp: , Rfl:  .  ondansetron (ZOFRAN) 4 MG tablet, Take 1 tablet (4 mg total) by mouth every 8 (eight) hours as needed for nausea or vomiting., Disp: 45 tablet, Rfl: 1 .  potassium chloride SA (K-DUR,KLOR-CON) 20 MEQ tablet, Take 20 mEq by mouth every evening., Disp: , Rfl:  .  prochlorperazine (COMPAZINE) 10 MG tablet, Take 1 tablet (10 mg total) by mouth at bedtime as needed for nausea or vomiting., Disp: 15 tablet, Rfl: 0 .  cholestyramine (QUESTRAN) 4 g packet, Take 1 packet (4 g total) by mouth every morning., Disp: 30 each, Rfl: 1 .  metoCLOPramide (REGLAN) 5 MG tablet, Take 1 tablet (5 mg total) by mouth at bedtime., Disp: 30 tablet, Rfl: 0  Current Facility-Administered Medications:  .  0.9 %  sodium chloride infusion, 500 mL, Intravenous,  Continuous, Danis, Kirke Corin, MD   Vital signs in last 24 hrs: Vitals:   03/03/17 1552  BP: 110/80  Pulse: 72    Physical Exam  Pleasant and well-appearing woman in no distress today  HEENT: sclera anicteric, oral mucosa moist without lesions  Neck: supple, no thyromegaly, JVD or lymphadenopathy  Cardiac: RRR without murmurs, S1S2 heard, no peripheral edema  Pulm: clear to auscultation bilaterally, normal RR and effort noted  Abdomen: soft, mild epigastric tenderness, with active bowel sounds. No guarding or palpable hepatosplenomegaly.  Skin; warm and dry, no jaundice or  rash    Radiologic studies:  MRCP nml  CMP Latest Ref Rng & Units 01/02/2017 07/11/2016  Glucose 65 - 99 mg/dL - 124(H)  BUN 6 - 20 mg/dL - 15  Creatinine 0.44 - 1.00 mg/dL - 1.00  Sodium 135 - 145 mmol/L - 138  Potassium 3.5 - 5.1 mmol/L - 3.6  Chloride 101 - 111 mmol/L - 103  CO2 22 - 32 mmol/L - 25  Calcium 8.9 - 10.3 mg/dL - 9.5  Total Protein 6.0 - 8.5 g/dL 7.2 8.0  Total Bilirubin 0.0 - 1.2 mg/dL <0.2 0.4  Alkaline Phos 39 - 117 IU/L 126(H) 119  AST 0 - 40 IU/L 17 20  ALT 0 - 32 IU/L 14 16     @ASSESSMENTPLANBEGIN @ Assessment: Encounter Diagnoses  Name Primary?  . Abdominal pain, chronic, epigastric Yes  . Abdominal bloating   . Diarrhea, unspecified type   . Nausea without vomiting    I continue to be puzzled by her constellation of GI symptoms.  Overall, I am still most suspicious of a functional bowel disorder.  She has not had improvement on antispasmodic therapy.  Upper endoscopy was unrevealing, imaging recently unrevealing.  I do not think she has developed adhesions after her surgery.  It is clear that the gallstones were not the entire cause of her symptoms. She does not seem to has risk factors for gastroparesis, but some gastric dysrhythmia such as tachycardia gastric could conceivably cause this.  I also suspect she is developed some bile acid diarrhea on top of a probable previous functional bowel disorder.   Plan: Discontinue the antispasmodic medicine, PPI and ondansetron to use metoclopramide 5 mg nightly.  I am trying this to hopefully prevent her early morning nausea which bothers her the most.  Morning dose of cholestyramine powder for probable bile acid diarrhea.  She will call us in a few weeks with an update.  If not much improved, almost likely refer her to an academic center such as the GI clinic at [facility].   Total time 30 minutes, over half spent in counseling and coordination of care.   Nelida Meuse III

## 2017-03-17 ENCOUNTER — Telehealth: Payer: Self-pay | Admitting: Gastroenterology

## 2017-03-18 NOTE — Telephone Encounter (Signed)
Left message for patient that I was returning her call.

## 2017-03-20 ENCOUNTER — Other Ambulatory Visit: Payer: Self-pay

## 2017-03-20 MED ORDER — METOCLOPRAMIDE HCL 5 MG PO TABS: 5.0000 mg | ORAL_TABLET | Freq: Every day | ORAL | 2 refills | Status: DC

## 2017-03-20 NOTE — Telephone Encounter (Signed)
Patient advised of recommended plan, refill sent to her pharmacy. Appointment made for 3/14 for follow up. She understands to call if needed.

## 2017-03-20 NOTE — Telephone Encounter (Signed)
Patient reports that her nausea is 95% better since starting the Reglan, very pleased with this result. She stopped the cholestyramine as it was causing constipation, even when dosage cut in 1/2. Since stopping it, she is having about 3 loose stools per day.

## 2017-03-20 NOTE — Telephone Encounter (Signed)
Glad to hear she is doing better.  Continue metoclopramide 5 mg QHS.  Send another script for a month supply with 2 refills if needed.  I believe her diarrhea is a combination of IBS, s/p chole and metoclopramide side effect. Can use low dose colestipol every other day or prn imodium.  Probably cannot make the diarrhea go away completely, just trying to make it manageable.  Please arrange for her to see me in about 6 weeks.

## 2017-04-29 NOTE — Progress Notes (Signed)
[facility] Progress Note  Chief Complaint: Chronic nausea  Subjective  History:  Sherry Cameron follows up after her last office visit in late January.  She has long-standing upper abdominal pain with early satiety bloating and nausea.  It initially seemed to be biliary colic, she had some improvement for a while after cholecystectomy.  The symptoms then returned, she had had an unrevealing upper endoscopy prior to cholecystectomy, including small bowel biopsies. She had also developed diarrhea since the cholecystectomy.  Overall, her entire clinical scenario seem to favor a functional bowel disorder.  At the last visit, I wondered if she might have a gastric dysrhythmia and bile acid diarrhea.  I put her on cholestyramine and gave her metoclopramide 5 mg nightly.  She contacted [place] afterwards to say the metoclopramide had made a significant reduction in her nausea.  However, the cholestyramine seem to make her constipated.  I recommended she take it less frequently.   Overall Sherry Cameron is doing well.  She has had considerable improvement taking the metoclopramide just in the evening.  There is occasional brief nausea in the morning, but nowhere near the intractable symptoms she had before.  Even low-dose cholestyramine gave her constipation, so she is just come to live with her alternating bowel habits.  She is very pleased to have had relief of the nausea with improvement in her life as a result.  As an aside, I asked when she last had a screening colonoscopy.  She believes it was done about 3 years ago in [place].  We will attempt to get those records to see when she is next due for a screening exam.  ROS: Cardiovascular:  no chest pain Respiratory: no dyspnea She expressed frustration over weight gain since early this year. The patient's Past Medical, Family and Social History were reviewed and are on file in the EMR.  Objective:  Med list reviewed  Current Outpatient Medications:  .   CARTIA XT 240 MG 24 hr capsule, Take 240 mg by mouth every evening. , Disp: , Rfl:  .  clonazePAM (KLONOPIN) 0.5 MG tablet, Take 0.5 mg by mouth 2 (two) times daily as needed for anxiety., Disp: , Rfl:  .  escitalopram (LEXAPRO) 20 MG tablet, Take 20 mg by mouth every evening. , Disp: , Rfl:  .  ibuprofen (ADVIL,MOTRIN) 800 MG tablet, Take 800 mg by mouth every 8 (eight) hours as needed for mild pain or moderate pain. , Disp: , Rfl:  .  levothyroxine (SYNTHROID, LEVOTHROID) 112 MCG tablet, Take 112 mcg by mouth daily before breakfast., Disp: , Rfl:  .  metoCLOPramide (REGLAN) 5 MG tablet, Take 1 tablet (5 mg total) by mouth at bedtime., Disp: 30 tablet, Rfl: 2 .  potassium chloride SA (K-DUR,KLOR-CON) 20 MEQ tablet, Take 20 mEq by mouth every evening., Disp: , Rfl:   Current Facility-Administered Medications:  .  0.9 %  sodium chloride infusion, 500 mL, Intravenous, Continuous, Danis, Estill Cotta III, MD   Vital signs in last 24 hrs: Vitals:   04/30/17 1543  BP: 106/76  Pulse: 80    Physical Exam  Well-appearing.    HEENT: sclera anicteric, oral mucosa moist without lesions  Neck: supple, no thyromegaly, JVD or lymphadenopathy  Cardiac: RRR without murmurs, S1S2 heard, no peripheral edema  Pulm: clear to auscultation bilaterally, normal RR and effort noted  Abdomen: soft, mild upper tenderness as before, with active bowel sounds. No guarding or palpable hepatosplenomegaly.  Skin; warm and dry, no jaundice  or rash  No recent data for review   @ASSESSMENTPLANBEGIN @ Assessment: Encounter Diagnoses  Name Primary?  . Abdominal pain, chronic, epigastric Yes  . Abdominal bloating   . Irritable bowel syndrome with both constipation and diarrhea   . Belching   . Nausea without vomiting    I believe she probably has a gastric dysrhythmia, less likely gastroparesis.  She is much improved on the low-dose Reglan, and we will continue it as long as it is working and not causing her any  problems.  I again reviewed the risk of tardive dyskinesia and demonstrated what that might look like.  She knows to contact us immediately and stop the medicine if something like that occurs.  At this point, I believe the benefits that medicine outweigh the risks for her.  She also has IBS that has not responded well to different medicines.  I think she will have to live with things as they are, and she is comfortable with that. We will try to get previous records as noted above.   She will otherwise see me in 6 months or sooner as needed.  Total time 15 minutes, over half spent in counseling and coordination of care.   Nelida Meuse III

## 2017-04-30 ENCOUNTER — Ambulatory Visit (INDEPENDENT_AMBULATORY_CARE_PROVIDER_SITE_OTHER): Payer: PRIVATE HEALTH INSURANCE | Admitting: Gastroenterology

## 2017-04-30 ENCOUNTER — Encounter (INDEPENDENT_AMBULATORY_CARE_PROVIDER_SITE_OTHER): Payer: Self-pay

## 2017-04-30 ENCOUNTER — Encounter: Payer: Self-pay | Admitting: Gastroenterology

## 2017-04-30 VITALS — BP 106/76 | HR 80 | Ht 62.0 in | Wt 169.4 lb

## 2017-04-30 DIAGNOSIS — R142 Eructation: Secondary | ICD-10-CM | POA: Diagnosis not present

## 2017-04-30 DIAGNOSIS — R110 Nausea: Secondary | ICD-10-CM | POA: Diagnosis not present

## 2017-04-30 DIAGNOSIS — R1013 Epigastric pain: Secondary | ICD-10-CM

## 2017-04-30 DIAGNOSIS — K582 Mixed irritable bowel syndrome: Secondary | ICD-10-CM | POA: Diagnosis not present

## 2017-04-30 DIAGNOSIS — R140 Abdominal distension (gaseous): Secondary | ICD-10-CM | POA: Diagnosis not present

## 2017-04-30 DIAGNOSIS — G8929 Other chronic pain: Secondary | ICD-10-CM | POA: Diagnosis not present

## 2017-04-30 NOTE — Patient Instructions (Signed)
If you are age 54 or older, your body mass index should be between 23-30. Your Body mass index is 30.98 kg/m. If this is out of the aforementioned range listed, please consider follow up with your Primary Care Provider.  If you are age 60 or younger, your body mass index should be between 19-25. Your Body mass index is 30.98 kg/m. If this is out of the aformentioned range listed, please consider follow up with your Primary Care Provider.   Follow up in 6 months.   Thank you for choosing [facility]  Dr Wilfrid Lund III

## 2017-06-29 ENCOUNTER — Telehealth: Payer: Self-pay | Admitting: Gastroenterology

## 2017-06-30 NOTE — Telephone Encounter (Signed)
Routed to Dr. Danis. 

## 2017-07-01 MED ORDER — METOCLOPRAMIDE HCL 5 MG PO TABS: 5.0000 mg | ORAL_TABLET | Freq: Two times a day (BID) | ORAL | 2 refills | Status: DC

## 2017-07-01 NOTE — Addendum Note (Signed)
Addended by: Nelida Meuse on: 07/01/2017 04:38 PM   Modules accepted: Orders

## 2017-07-01 NOTE — Telephone Encounter (Signed)
Patient advised to increase reglan, take additional dose in morning. She is scheduled for follow up in July. She has physical in June, asked her if late in June to please get 12 lead EKG done prior and fax to our office.

## 2017-07-01 NOTE — Telephone Encounter (Signed)
She can take an additional dose of metoclopramide in the morning if needed.  I will send a new prescription.  Please arrange a follow up clinic appointment with me within the next 2 months.  She also needs a 12-lead EKG to check baseline electrical function of heart because of this medicine.  Please have her do that at PCP office and then fax to [place].

## 2017-07-08 ENCOUNTER — Telehealth: Payer: Self-pay | Admitting: Gastroenterology

## 2017-07-08 NOTE — Telephone Encounter (Signed)
Faxed EKG order to Dr. Bretta Bang per patient request. Fax: [phone].

## 2017-08-24 ENCOUNTER — Ambulatory Visit (INDEPENDENT_AMBULATORY_CARE_PROVIDER_SITE_OTHER): Payer: PRIVATE HEALTH INSURANCE | Admitting: Gastroenterology

## 2017-08-24 ENCOUNTER — Encounter: Payer: Self-pay | Admitting: Gastroenterology

## 2017-08-24 VITALS — BP 116/72 | HR 71 | Ht 62.0 in | Wt 169.0 lb

## 2017-08-24 DIAGNOSIS — K580 Irritable bowel syndrome with diarrhea: Secondary | ICD-10-CM | POA: Diagnosis not present

## 2017-08-24 DIAGNOSIS — R110 Nausea: Secondary | ICD-10-CM | POA: Diagnosis not present

## 2017-08-24 DIAGNOSIS — R140 Abdominal distension (gaseous): Secondary | ICD-10-CM | POA: Diagnosis not present

## 2017-08-24 NOTE — Progress Notes (Signed)
[facility] Progress Note  Chief Complaint: Nausea and diarrhea Subjective  History:  Sherry Cameron follows up for her nausea and vomiting as well as abdominal pain and diarrhea.  She has long-standing IBS that did not improve with antispasmodic or ondansetron.  I believe her IBS is worsened by bile acid diarrhea, however cholestyramine caused constipation so she discontinued it.  She probably has a gastric dysrhythmia, since her intractable nausea was significantly improved on metoclopramide.  She called since her last visit in March as needed if she could take it twice a day on some days.  While the nausea has definitely improved on twice daily Reglan, she continues to have abdominal pain with cramping and urgency and stool that is always loose.  She is also frustrated she is unable to lose weight. The diarrhea subsides on the weekend when she is not at work ROS: Cardiovascular:  no chest pain Respiratory: no dyspnea  The patient's Past Medical, Family and Social History were reviewed and are on file in the EMR.  Objective:  Med list reviewed  Current Outpatient Medications:  .  CARTIA XT 240 MG 24 hr capsule, Take 240 mg by mouth every evening. , Disp: , Rfl:  .  clonazePAM (KLONOPIN) 0.5 MG tablet, Take 0.5 mg by mouth daily. , Disp: , Rfl:  .  escitalopram (LEXAPRO) 20 MG tablet, Take 20 mg by mouth every evening. , Disp: , Rfl:  .  ibuprofen (ADVIL,MOTRIN) 800 MG tablet, Take 800 mg by mouth every 8 (eight) hours as needed for mild pain or moderate pain. , Disp: , Rfl:  .  levothyroxine (SYNTHROID, LEVOTHROID) 112 MCG tablet, Take 112 mcg by mouth daily before breakfast., Disp: , Rfl:  .  metoCLOPramide (REGLAN) 5 MG tablet, Take 1 tablet (5 mg total) by mouth 2 (two) times daily., Disp: 60 tablet, Rfl: 2 .  potassium chloride SA (K-DUR,KLOR-CON) 20 MEQ tablet, Take 20 mEq by mouth every evening., Disp: , Rfl:  .  Vitamin D, Ergocalciferol, (DRISDOL) 50000 units CAPS capsule, Take  50,000 Units by mouth every 30 (thirty) days., Disp: , Rfl:    Vital signs in last 24 hrs: Vitals:   08/24/17 1606  BP: 116/72  Pulse: 71    Physical Exam  Well-appearin  HEENT: sclera anicteric, oral mucosa moist without lesions  Neck: supple, no thyromegaly, JVD or lymphadenopathy  Cardiac: RRR without murmurs, S1S2 heard, no peripheral edema  Pulm: clear to auscultation bilaterally, normal RR and effort noted  Abdomen: soft, no tenderness, with active bowel sounds. No guarding or palpable hepatosplenomegaly.  Skin; warm and dry, no jaundice or rash  Recent Labs:  She brought a copy of her most recent EKG done at primary care on 07/22/2017 that I requested to check the QTC because she is on metoclopramide.  It is a normal EKG including QTC.  Radiologic studies:  Colonoscopy from June 2012 by Dr. Quincy Simmonds in [place] reports a complete exam to the cecum, no polyps, small internal hemorrhoids and biopsy of the rectum for microscopic colitis was normal.    @ASSESSMENTPLANBEGIN @ Assessment: Encounter Diagnoses  Name Primary?  . Nausea without vomiting Yes  . Abdominal bloating   . Irritable bowel syndrome with diarrhea    I believe her nausea is a likely gastric dysrhythmia although that has not been proven by any testing.  She is considerably improved on metoclopramide and plans to continue this.  She asked about domperidone, which I think is definitely worth  considering since it does not have the risk of tardive dyskinesia.  We gave her information about how to possibly obtain this from [facility].  She would like to call and find out about the cost and logistics.  If she decides to proceed with that, she will let us know so we can provide a prescription.  Meanwhile, she will continue metoclopramide 5 mg twice daily.  She is again cautioned about the risk of tardive dyskinesia and I described what that might look like. QTC remains normal on this  medicine.  I also believe she has IBS with diarrhea exacerbated by side effect of metoclopramide and bile acid diarrhea.  She did not tolerate cholestyramine due to bloating and constipation. Cannot take Viberzi since post cholecystectomy.  Plan: I had an initial discussion about a trial of Lotronex, but she does not want to take any other medicines at this point. I would be happy to revisit this if she changes her mind, and we would need to discuss the risk of ischemic colitis with this medicine in more detail. She can see me in 6 months or sooner as needed.   Next screening colonoscopy June 2022   total time 25 minutes, over half spent face-to-face with patient in counseling and coordination of care.   Sherry Cameron

## 2017-08-24 NOTE — Patient Instructions (Signed)
If you are age 54 or older, your body mass index should be between 23-30. Your Body mass index is 30.91 kg/m. If this is out of the aforementioned range listed, please consider follow up with your Primary Care Provider.  If you are age 67 or younger, your body mass index should be between 19-25. Your Body mass index is 30.91 kg/m. If this is out of the aformentioned range listed, please consider follow up with your Primary Care Provider.   It was a pleasure to see you today!  Dr. Loletha Carrow

## 2017-10-14 ENCOUNTER — Other Ambulatory Visit: Payer: Self-pay | Admitting: Gastroenterology

## 2018-01-13 ENCOUNTER — Other Ambulatory Visit: Payer: Self-pay | Admitting: Gastroenterology

## 2018-01-18 NOTE — Telephone Encounter (Signed)
Please arrange an appointment with me in the next two months.  I refilled her medicine.

## 2018-03-13 ENCOUNTER — Encounter: Payer: Self-pay | Admitting: Gastroenterology

## 2018-03-22 ENCOUNTER — Other Ambulatory Visit: Payer: Self-pay | Admitting: Gastroenterology

## 2018-05-18 ENCOUNTER — Other Ambulatory Visit: Payer: Self-pay | Admitting: Gastroenterology

## 2018-05-22 IMAGING — US US ABDOMEN LIMITED
1 series · 14 of 25 positions shown · non-contrast
Comparison: None.

CLINICAL DATA: Right upper quadrant pain

EXAM:
US ABDOMEN LIMITED - RIGHT UPPER QUADRANT

[Series 1: us abdomen limited · 0.25mm/px · 14 of 60 slices shown]
[im 1/60]
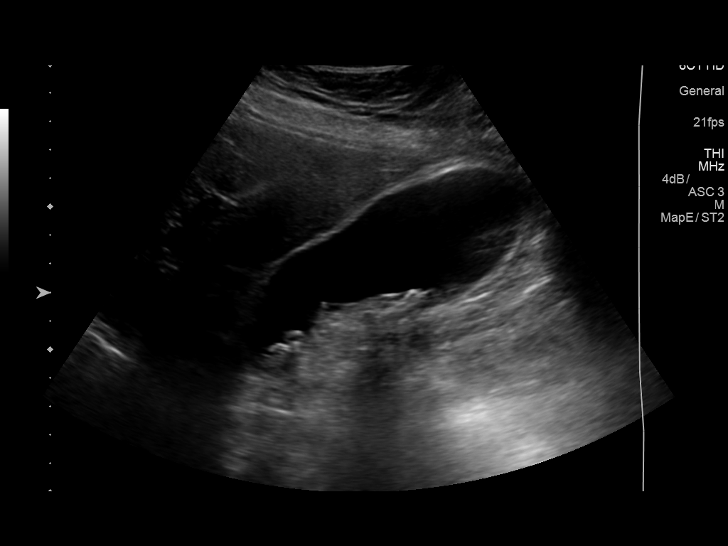
[im 5/60]
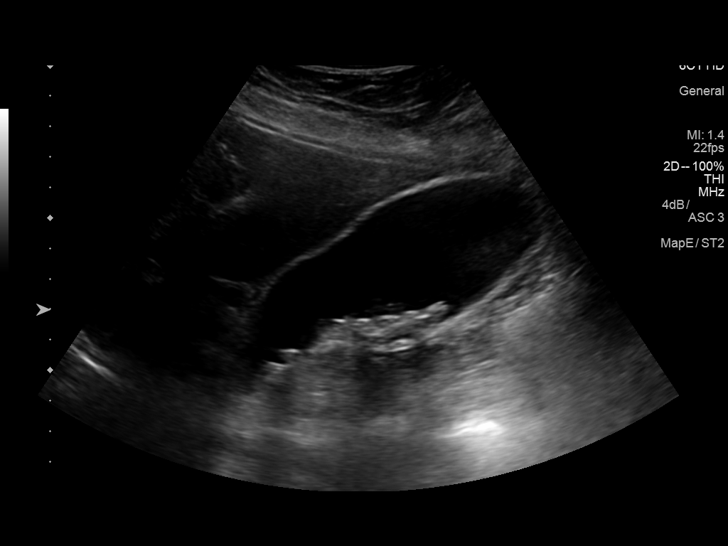
[im 10/60]
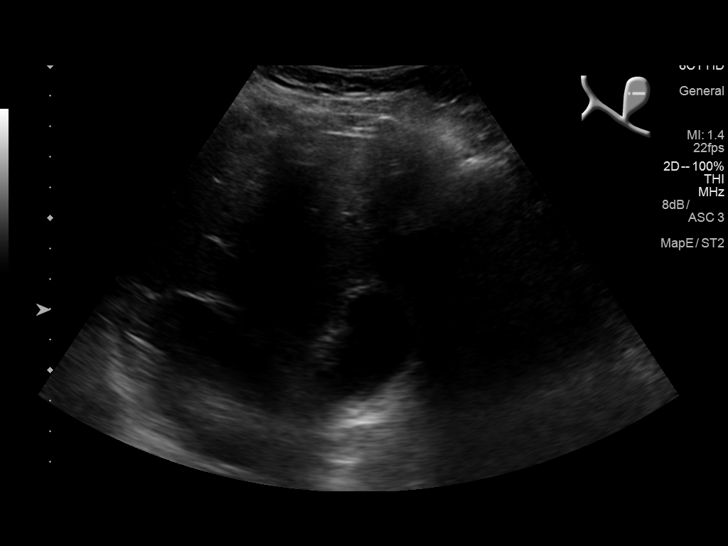
[im 15/60]
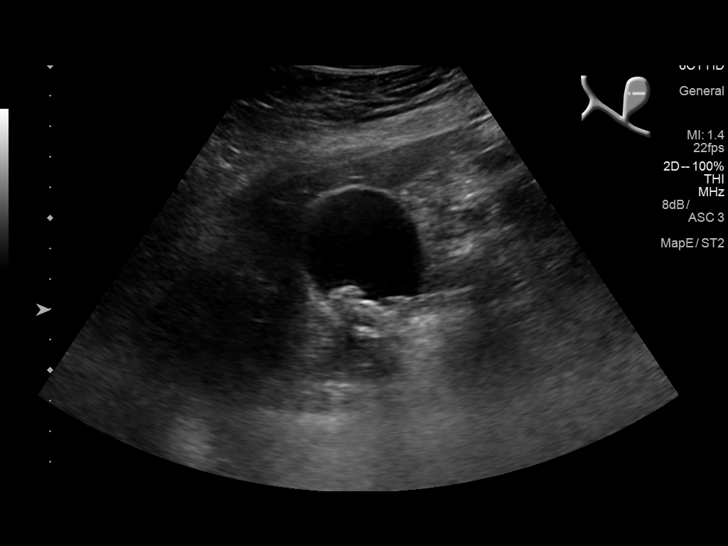
[im 20/60]
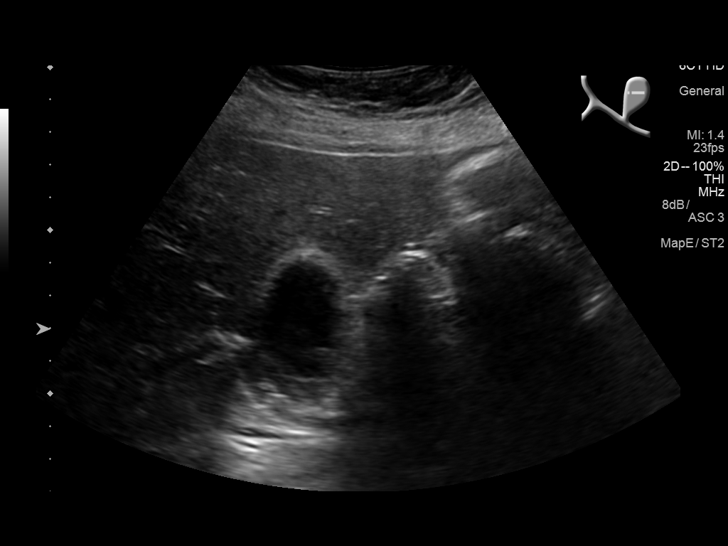
[im 23/60]
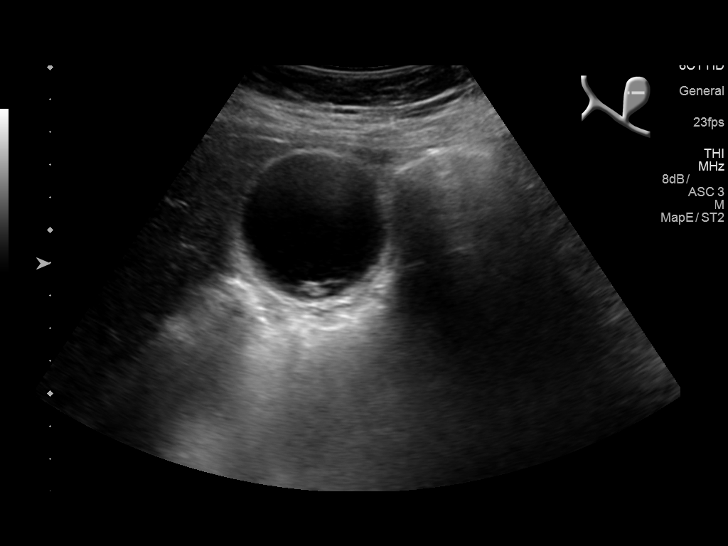
[im 28/60]
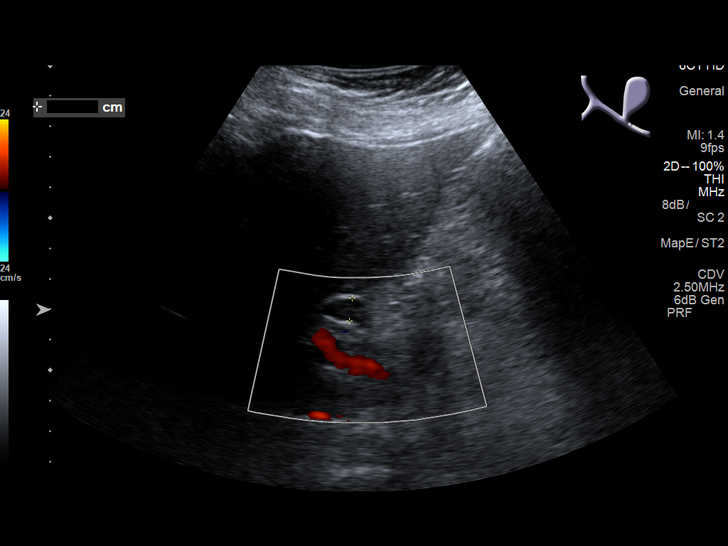
[im 32/60]
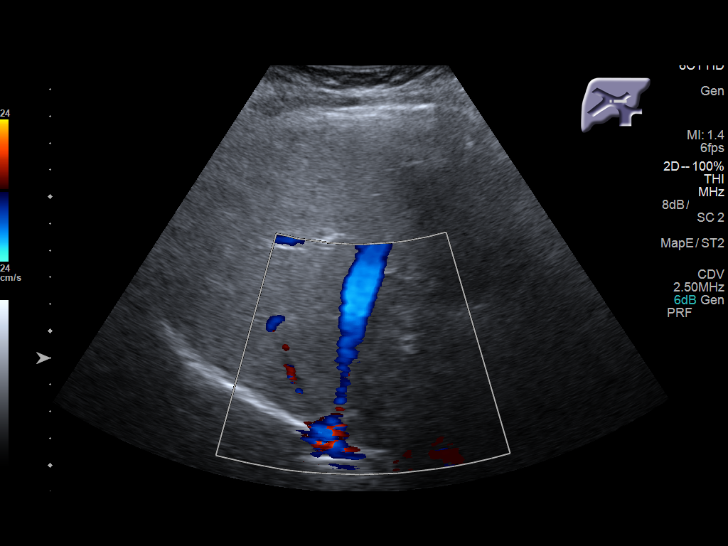
[im 37/60]
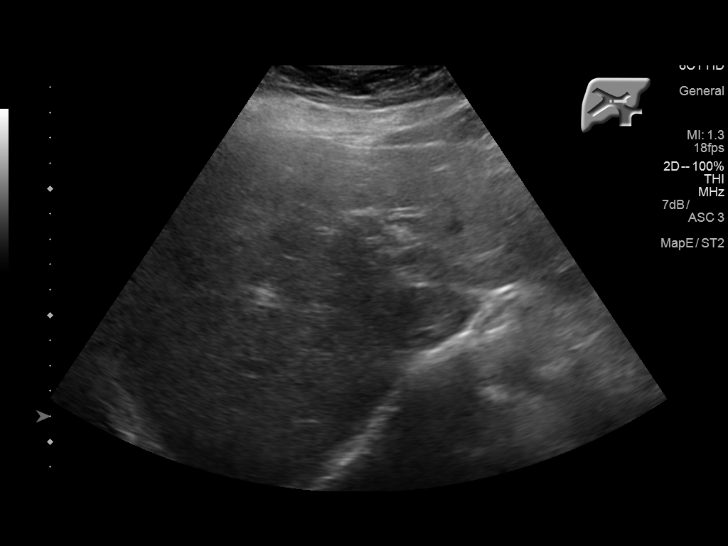
[im 40/60]
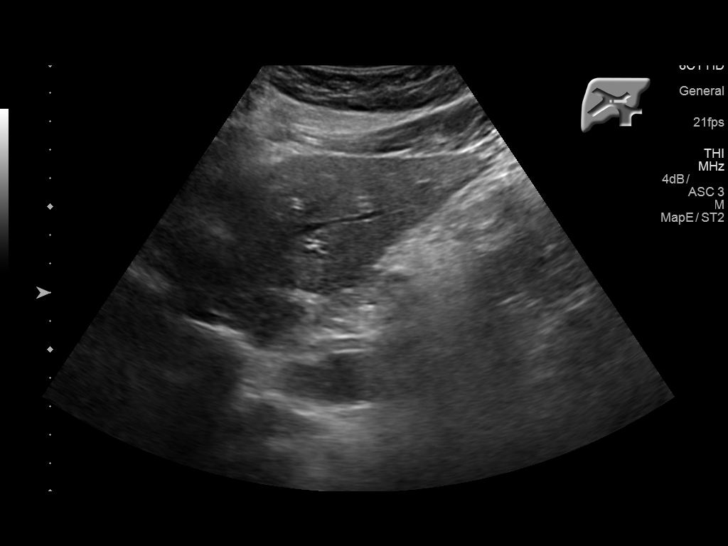
[im 45/60]
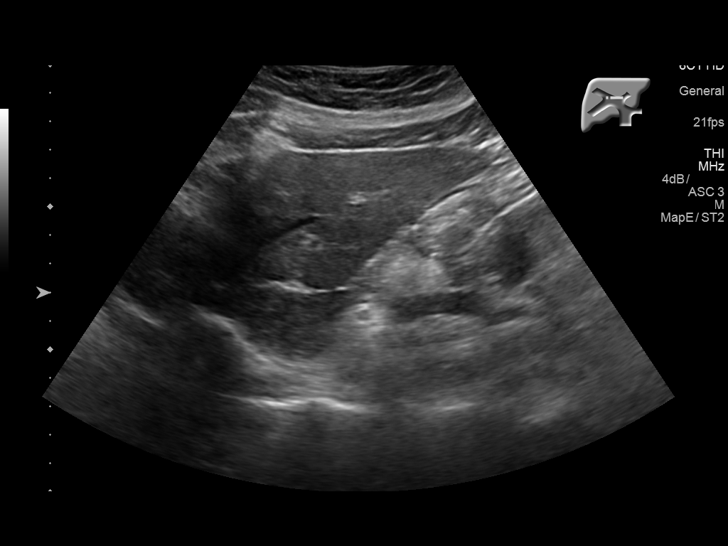
[im 50/60]
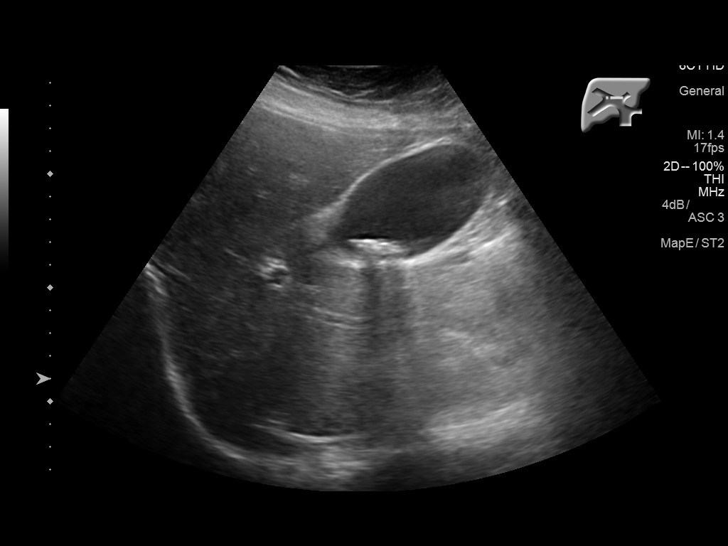
[im 55/60]
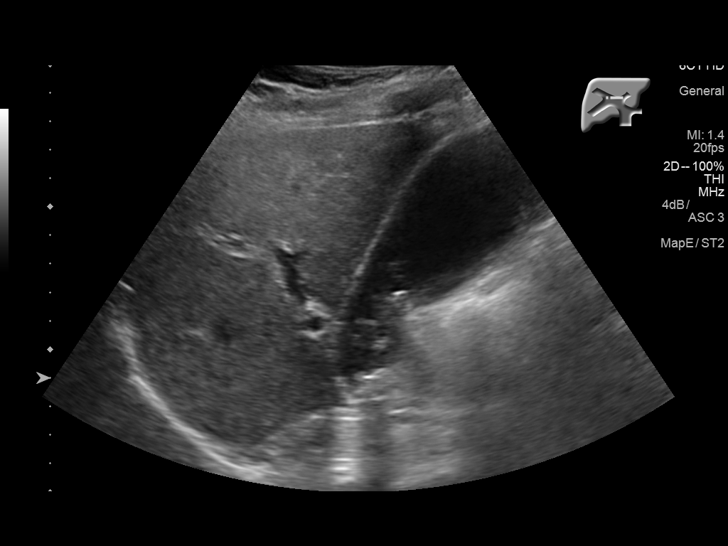
[im 60/60]
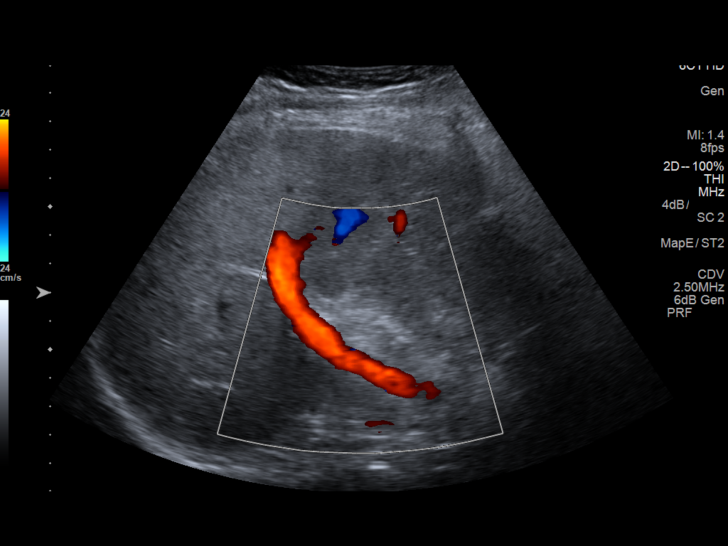

[14 of 25 positions shown; findings below may reference images not displayed]

FINDINGS: Gallbladder:

Multiple gallstones measuring up to 1.3 cm in diameter. No
gallbladder wall thickening. Negative sonographic Murphy sign

Common bile duct:

Diameter: 7.6 mm, upper normal

Liver:

No focal lesion identified. Within normal limits in parenchymal
echogenicity.
IMPRESSION: Cholelithiasis without evidence of acute cholecystitis.

Common bile duct upper normal 7.6 mm in diameter.

## 2018-07-20 ENCOUNTER — Other Ambulatory Visit: Payer: Self-pay | Admitting: Gastroenterology

## 2018-07-20 ENCOUNTER — Telehealth: Payer: Self-pay | Admitting: Gastroenterology

## 2018-07-20 NOTE — Telephone Encounter (Signed)
I see the refill request for her metoclopramide.  It has been nearly a year since this patient was seen in clinic, and she has chronic conditions requiring medicine.  The metoclopramide requires monitoring.  She needs to have an EKG done at her primary care provider's office to make sure there is no abnormality of the " QT interval" on EKG.  Please also arrange a telemedicine appointment with me.

## 2018-07-20 NOTE — Telephone Encounter (Signed)
Spoke with pt and she is aware, states she has an appt with PCP 07/26/18. Scheduled telehealth visit with Dr. Loletha Carrow 07/27/18@3pm . Pt aware of appt. Pt states she will be out of reglan after tonight and she is afraid she will be quite sick without it. Pt wants to know if she can have it refilled to at least get through until after office visit. Please advise.

## 2018-07-20 NOTE — Telephone Encounter (Signed)
Please check to be sure, but I believe it was already refilled earlier today, as it was sent to me for cosignature.  That is how I realized she had not been seen in some time and needed follow-up.

## 2018-07-21 NOTE — Telephone Encounter (Signed)
Spoke with pt and let her know that the refill had been sent to her pharmacy and to keep her appts as scheduled.

## 2018-07-27 ENCOUNTER — Other Ambulatory Visit: Payer: Self-pay

## 2018-07-27 ENCOUNTER — Encounter: Payer: Self-pay | Admitting: Gastroenterology

## 2018-07-27 ENCOUNTER — Ambulatory Visit: Payer: PRIVATE HEALTH INSURANCE | Admitting: Gastroenterology

## 2018-07-27 VITALS — Ht 62.5 in | Wt 176.0 lb

## 2018-07-27 DIAGNOSIS — R140 Abdominal distension (gaseous): Secondary | ICD-10-CM

## 2018-07-27 DIAGNOSIS — R110 Nausea: Secondary | ICD-10-CM

## 2018-07-27 DIAGNOSIS — K580 Irritable bowel syndrome with diarrhea: Secondary | ICD-10-CM

## 2018-07-27 NOTE — Progress Notes (Addendum)
This patient contacted our office requesting a physician telemedicine consultation regarding clinical questions and/or test results. Interactive audio and video telecommunications were attempted between this provider and the patient.  However, this technology failed due to the patient having technical difficulties OR they did not have access to video capabilities.  We continued and completed the visit with audio only.   Participants on the conference : myself and patient   The patient consented to this consultation and was aware that a charge will be placed through their insurance.  I was in my office and the patient was at home   Encounter time:  Total time 20 minutes, with 15 minutes spent with patient on phone   _____________________________________________________________________________________________               [facility]  Chief Complaint: Nausea  Subjective  History: Last visit July 2019 as follows: "I believe her nausea is a likely gastric dysrhythmia although that has not been proven by any testing.  She is considerably improved on metoclopramide and plans to continue this.  She asked about domperidone, which I think is definitely worth considering since it does not have the risk of tardive dyskinesia.  We gave her information about how to possibly obtain this from [facility].  She would like to call and find out about the cost and logistics.  If she decides to proceed with that, she will let us know so we can provide a prescription.  Meanwhile, she will continue metoclopramide 5 mg twice daily.  She is again cautioned about the risk of tardive dyskinesia and I described what that might look like. QTC remains normal on this medicine.   I also believe she has IBS with diarrhea exacerbated by side effect of metoclopramide and bile acid diarrhea.  She did not tolerate cholestyramine due to bloating and constipation. Cannot take Viberzi since post  cholecystectomy."    I offered a trial of Lotronex, but she decided against it. Symptoms are much the same as before. Awakens with nausea, brief if takes reglan 10 mg at bedtime.  Still with lower abdominal crampy pain and loose stool up to 5 times, then occasionally no BM for 2 days. Does not get as much diarrhea at home on weekend.  Went to PCP yesterday for EKG (has not yet been rec'd by fax)   ROS: Cardiovascular:  no chest pain Respiratory: no dyspnea Frustrated she cannot lose weight, and thinks her PCP might prescribe some medicine for that.  The patient's Past Medical, Family and Social History were reviewed and are on file in the EMR.  Objective:  Med list reviewed  Current Outpatient Medications:  .  CARTIA XT 240 MG 24 hr capsule, Take 240 mg by mouth every evening. , Disp: , Rfl:  .  clonazePAM (KLONOPIN) 0.5 MG tablet, Take 0.5 mg by mouth daily. , Disp: , Rfl:  .  escitalopram (LEXAPRO) 20 MG tablet, Take 20 mg by mouth every evening. , Disp: , Rfl:  .  ibuprofen (ADVIL,MOTRIN) 800 MG tablet, Take 800 mg by mouth every 8 (eight) hours as needed for mild pain or moderate pain. , Disp: , Rfl:  .  levothyroxine (SYNTHROID, LEVOTHROID) 112 MCG tablet, Take 112 mcg by mouth daily before breakfast., Disp: , Rfl:  .  metoCLOPramide (REGLAN) 5 MG tablet, Take 1 tablet by mouth twice daily, Disp: 120 tablet, Rfl: 0 .  Vitamin D, Ergocalciferol, (DRISDOL) 50000 units CAPS capsule, Take 50,000 Units by mouth every  30 (thirty) days., Disp: , Rfl:    No exam -  Virtual visit   @ASSESSMENTPLANBEGIN @ Assessment: Encounter Diagnoses  Name Primary?  . Nausea without vomiting Yes  . Irritable bowel syndrome with diarrhea   . Abdominal bloating    Her symptoms of functional bowel disorder remain unchanged.  She has had either poor relief from or side effects to several meds.  She still did not want to consider the Lotronex.  We again discussed that the neurologic and cardiac  risks of metoclopramide.  She is understanding as before, and would very much like to consider it as long she seems to be tolerating it well.  Without taking it, she has protracted vomiting every morning and finds it difficult to function.  Overall, I think this is most consistent with a gastric dysrhythmia and somehow tied in with her functional bowel disorder.   Plan: I will have my office staff make sure we have received the EKG from primary care.  If not, we will request it.  QTc should be followed with a patient on metoclopramide.  Also, I believe I have convinced her to try domperidone, which can be obtained through a [facility].  We had given her information about this before, but she requests it again and now seems agreeable to obtaining domperidone.  She understands it would be safer in the long run.  There is still the need to monitor cardiac rhythm but we can be less concerned about neurologic side effect.   Nelida Meuse III   Addendum 07/28/2018:  Twelve-lead EKG from [facility] primary care dated 07/26/2018, Dr. Bretta Bang  Normal sinus rhythm, normal QTC  H. Loletha Carrow, MD

## 2018-07-27 NOTE — Patient Instructions (Signed)
try domperidone, which can be obtained through a [facility].  You have received  information about this before, but requests it again and now is agreeable to obtaining domperidone.  She understands it would be safer in the long run.  There is still the need to monitor cardiac rhythm. We will get recent EKG from your PCP in [place]  Thank you for entrusting me with your care and choosing [facility].  Dr Loletha Carrow

## 2018-07-28 ENCOUNTER — Telehealth: Payer: Self-pay

## 2018-07-28 NOTE — Telephone Encounter (Signed)
Pt aware that EKG has been review and ok to continue the Reglan. She said she would like [place] to send the domperidone to the [facility]. I don't see the direct medication order to do so. Please advise thx

## 2018-07-30 ENCOUNTER — Other Ambulatory Visit: Payer: Self-pay

## 2018-07-30 MED ORDER — AMBULATORY NON FORMULARY MEDICATION: 1.0000 | Freq: Every day | 0 refills | Status: DC

## 2018-07-30 NOTE — Telephone Encounter (Signed)
I believe I have successfully created the non-formulary prescription to be printed and sent to the pharmacy.  Please let me know if any problems.

## 2018-09-03 ENCOUNTER — Other Ambulatory Visit: Payer: Self-pay

## 2018-09-03 ENCOUNTER — Telehealth: Payer: Self-pay | Admitting: Gastroenterology

## 2018-09-03 MED ORDER — AMBULATORY NON FORMULARY MEDICATION: 1.0000 | Freq: Every day | 0 refills | Status: DC

## 2018-09-03 NOTE — Telephone Encounter (Signed)
She can stop the metoclopramide once she starts the domperidone.  It is a substitution.

## 2018-09-03 NOTE — Telephone Encounter (Signed)
Pls call pt, she needs to speak with you about a medication that she is trying to get from a [facility].

## 2018-09-03 NOTE — Telephone Encounter (Signed)
Pt has been notified and aware.  

## 2018-09-03 NOTE — Telephone Encounter (Signed)
Patient reached out to the [facility]. Needs Rx to be re-faxed. Printed new Rx for #100. Per pharmacy request. Patient is asking how to stop the Reglan once she receives the domperidone. Pleas advise.

## 2018-12-23 ENCOUNTER — Telehealth: Payer: Self-pay | Admitting: Gastroenterology

## 2018-12-23 ENCOUNTER — Other Ambulatory Visit: Payer: Self-pay

## 2018-12-23 MED ORDER — AMBULATORY NON FORMULARY MEDICATION: 1.0000 | Freq: Every day | 1 refills | Status: DC

## 2018-12-23 NOTE — Telephone Encounter (Signed)
Patient has requested refills, Domperidone 10 mg, she would like to get 200 tabs this time. Do you approve the refill request? Thank you.

## 2018-12-23 NOTE — Telephone Encounter (Signed)
Done. Pt aware 

## 2018-12-23 NOTE — Telephone Encounter (Signed)
yes

## 2018-12-23 NOTE — Telephone Encounter (Signed)
Pt needs to speak with you regarding prescription for Domperidone. Pls call her.

## 2019-01-02 IMAGING — MR MR 3D RECON AT [facility]
12 series · 16 of 16 positions shown · non-contrast
Comparison: Ultrasound exam 07/11/2016

CLINICAL DATA: Status post cholecystectomy about 6 months ago with
persistent right upper quadrant pain and elevated LFTs.

EXAM:
MRI ABDOMEN WITHOUT CONTRAST  (INCLUDING MRCP)
TECHNIQUE: Multiplanar multisequence MR imaging of the abdomen was performed.
Heavily T2-weighted images of the biliary and pancreatic ducts were
obtained, and three-dimensional MRCP images were rendered by post
processing.

[Series 3: T2 fat-sat · axial · 5.0mm · 0.78mm/px · 1 of 48 slices shown]
[im 1/48]
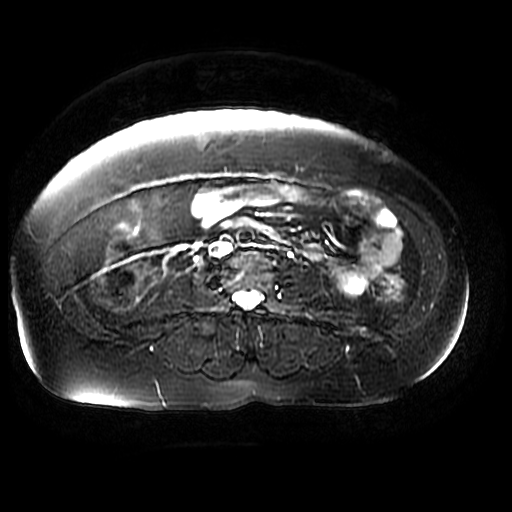

[Series 4: DWI b500 · axial · 6.0mm · 1.48mm/px · 1 of 60 slices shown]
[im 1/60]
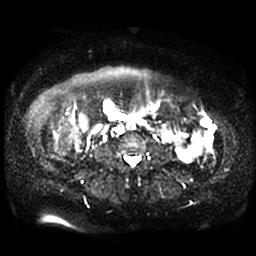

[Series 5: MRCP · coronal · 1.6mm · 0.64mm/px · 1 of 116 slices shown (1 of 3)]
[im 1/116]
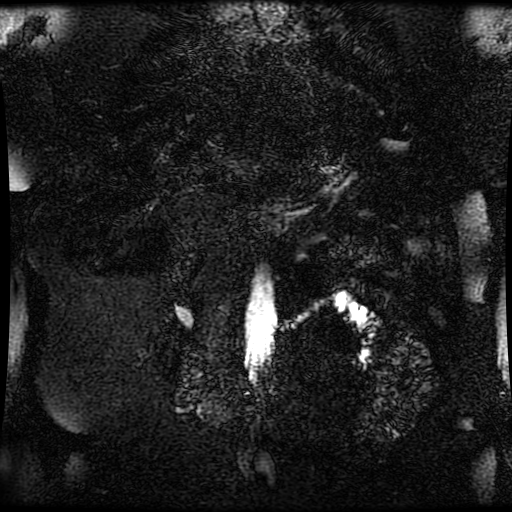

[Series 6: MRCP · coronal · 2.0mm · 0.70mm/px · 1 of 47 slices shown (2 of 3)]
[im 1/47]
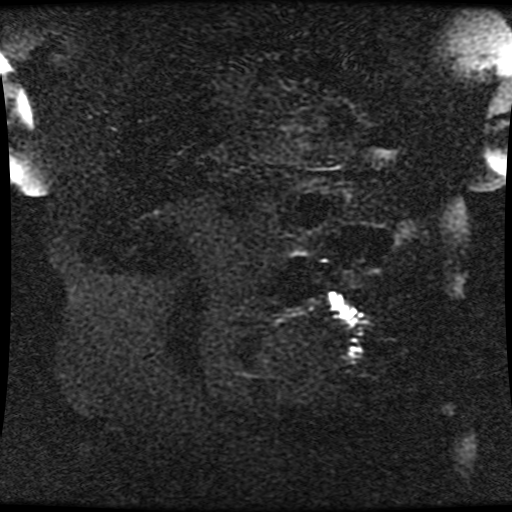

[Series 8: ax dualecho · axial · 5.0mm · 0.78mm/px · z∈[-93,+122]mm · 2 of 88 slices shown]
[im 1/88]
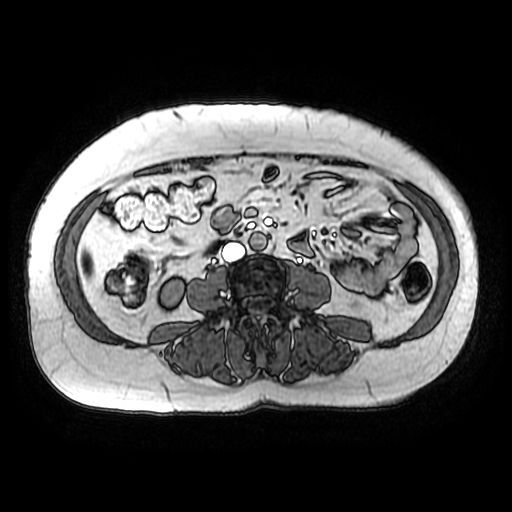
[im 88/88]
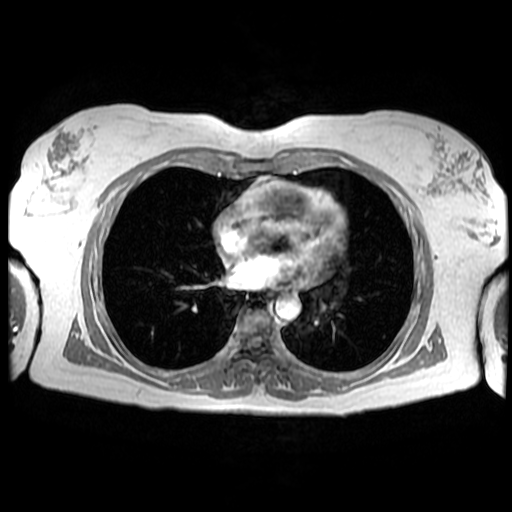

[Series 9: bSSFP fat-sat · coronal · 5.0mm · 0.74mm/px · 1 of 39 slices shown]
[im 1/39]
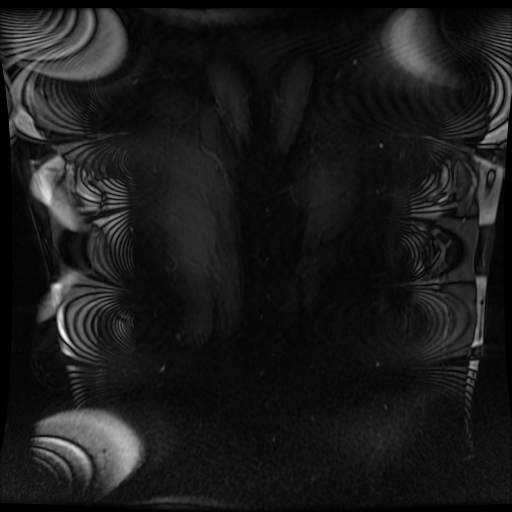

[Series 10: T2 · axial · 5.0mm · 0.78mm/px · 1 of 44 slices shown]
[im 1/44]
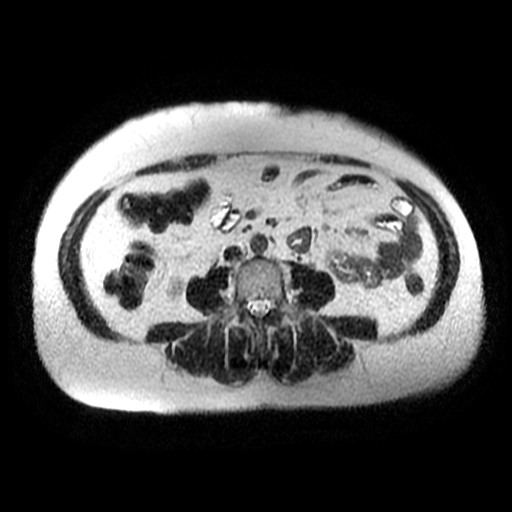

[Series 11: MRCP · coronal · 40.0mm · 0.70mm/px · 1 of 7 slices shown (3 of 3)]
[im 1/7]
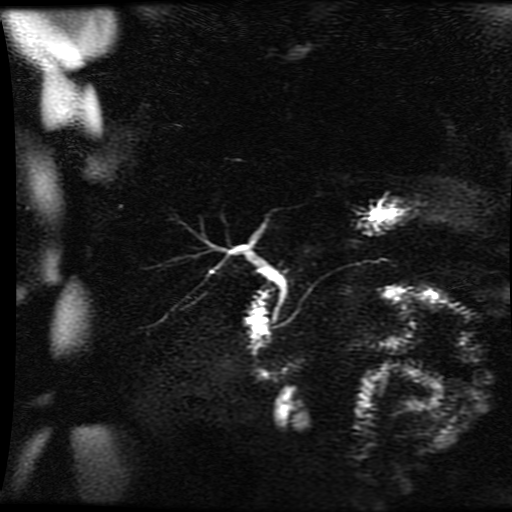

[Series 12: T1 dynamic · axial · 5.0mm · 0.78mm/px · z∈[-105,+123]mm · 2 of 92 slices shown (1 of 2)]
[im 1/92]
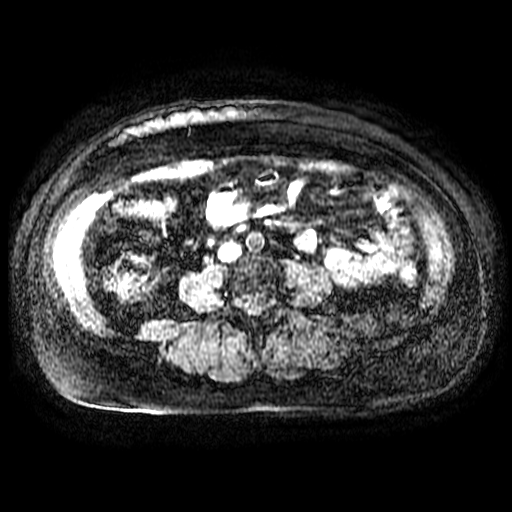
[im 92/92]
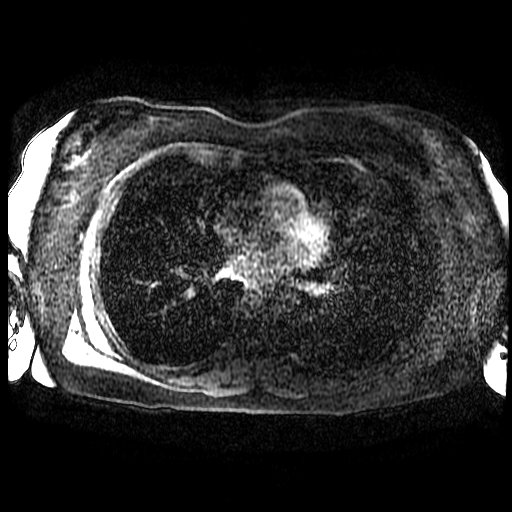

[Series 13: T1 dynamic · coronal · 4.0mm · 0.78mm/px · 2 of 96 slices shown (2 of 2)]
[im 1/96]
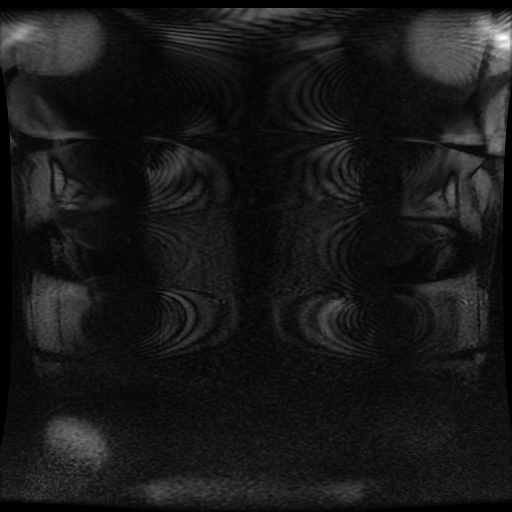
[im 96/96]
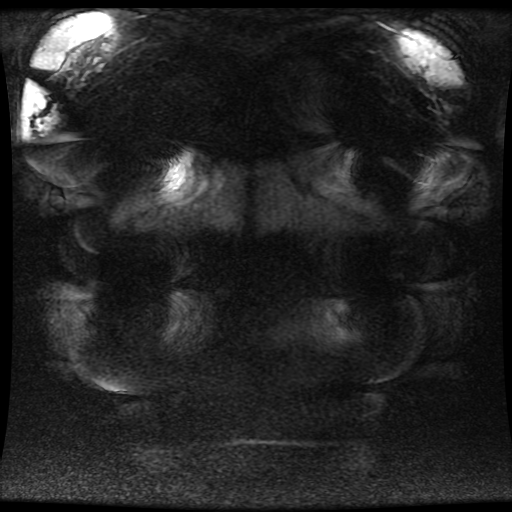

[Series 400: DWI · axial · 6.0mm · 1.48mm/px · 1 of 30 slices shown]
[im 1/30]
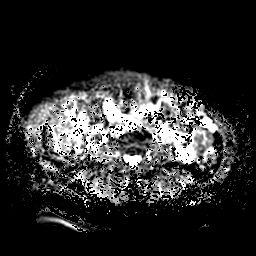

[Series 500: reformatted · axial · 1.6mm · 0.64mm/px · z∈[+37,+167]mm · 2 of 104 slices shown]
[im 1/104]
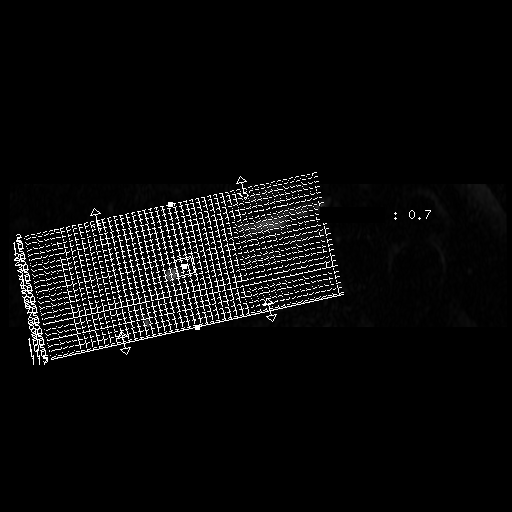
[im 104/104]
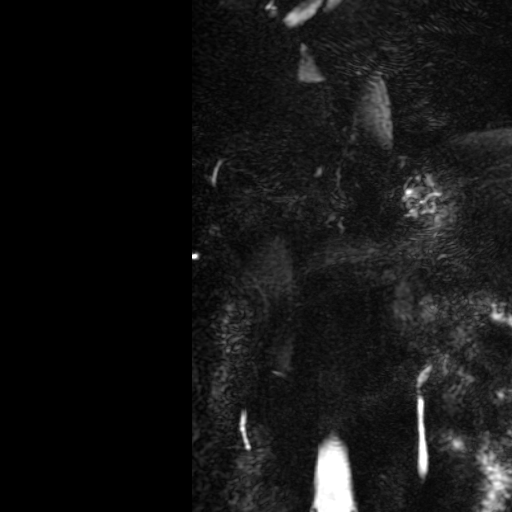

[16 of 16 positions shown; findings below may reference images not displayed]

FINDINGS: Lower chest:  Unremarkable.

Hepatobiliary: No focal abnormality in the liver on this study
without intravenous contrast. Gallbladder surgically absent.
Extrahepatic common duct measures 7 mm diameter. Common bile duct in
the head of the pancreas measures 4 mm diameter. There is no filling
defect in the extrahepatic biliary system to suggest
choledocholithiasis.

Pancreas: No focal mass lesion. No dilatation of the main duct. No
intraparenchymal cyst. No peripancreatic edema.

Spleen: No splenomegaly. No focal mass lesion.

Adrenals/Urinary Tract: No adrenal nodule or mass. Kidneys are
unremarkable.

Stomach/Bowel: Stomach is nondistended. No gastric wall thickening.
No evidence of outlet obstruction. Duodenum is normally positioned
as is the ligament of Treitz. No small bowel or colonic dilatation
within the visualized abdomen.

Vascular/Lymphatic: No abdominal aortic aneurysm There is no
gastrohepatic or hepatoduodenal ligament lymphadenopathy. No
intraperitoneal or retroperitoneal lymphadenopathy.

Other: No intraperitoneal free fluid.

Musculoskeletal: No abnormal marrow signal within the visualized
bony anatomy
IMPRESSION: 1. Status post cholecystectomy with upper normal to borderline
distended common duct (given cholecystectomy). No
choledocholithiasis.
2. Otherwise unremarkable exam.

## 2019-06-03 ENCOUNTER — Ambulatory Visit: Payer: PRIVATE HEALTH INSURANCE | Admitting: Gastroenterology

## 2019-07-26 ENCOUNTER — Encounter: Payer: Self-pay | Admitting: Gastroenterology

## 2019-07-26 ENCOUNTER — Ambulatory Visit (INDEPENDENT_AMBULATORY_CARE_PROVIDER_SITE_OTHER): Payer: PRIVATE HEALTH INSURANCE | Admitting: Gastroenterology

## 2019-07-26 VITALS — BP 114/70 | HR 80 | Ht 62.0 in | Wt 175.2 lb

## 2019-07-26 DIAGNOSIS — K582 Mixed irritable bowel syndrome: Secondary | ICD-10-CM

## 2019-07-26 DIAGNOSIS — D509 Iron deficiency anemia, unspecified: Secondary | ICD-10-CM | POA: Diagnosis not present

## 2019-07-26 DIAGNOSIS — R110 Nausea: Secondary | ICD-10-CM | POA: Diagnosis not present

## 2019-07-26 NOTE — Progress Notes (Signed)
[facility]  Chief Complaint: Nausea and diarrhea  Subjective  History: From my last office note June 2020: Her symptoms of functional bowel disorder remain unchanged.  She has had either poor relief from or side effects to several meds.  She still did not want to consider the Lotronex.  We again discussed that the neurologic and cardiac risks of metoclopramide.  She is understanding as before, and would very much like to consider it as long she seems to be tolerating it well.  Without taking it, she has protracted vomiting every morning and finds it difficult to function.  Overall, I think this is most consistent with a gastric dysrhythmia and somehow tied in with her functional bowel disorder.     Plan: I will have my office staff make sure we have received the EKG from primary care.  If not, we will request it.  QTc should be followed with a patient on metoclopramide.   Also, I believe I have convinced her to try domperidone, which can be obtained through a [facility].  We had given her information about this before, but she requests it again and now seems agreeable to obtaining domperidone.  She understands it would be safer in the long run.  There is still the need to monitor cardiac rhythm but we can be less concerned about neurologic side effect.     Nelida Meuse III    Addendum 07/28/2018:   Twelve-lead EKG from [facility] primary care dated 07/26/2018, Dr. Bretta Bang   Normal sinus rhythm, normal QTC   H. Danis, MD I believe her nausea is a likely gastric dysrhythmia although that has not been proven by any testing.  She is considerably improved on metoclopramide and plans to continue this.  She asked about domperidone, which I think is definitely worth considering since it does not have the risk of tardive dyskinesia.  We gave her information about how to possibly obtain this from [facility].  She would like to call and find out about the cost  and logistics.  If she decides to proceed with that, she will let us know so we can provide a prescription.  Meanwhile, she will continue metoclopramide 5 mg twice daily.  She is again cautioned about the risk of tardive dyskinesia and I described what that might look like. QTC remains normal on this medicine.   I also believe she has IBS with diarrhea exacerbated by side effect of metoclopramide and bile acid diarrhea.  She did not tolerate cholestyramine due to bloating and constipation. Cannot take Viberzi since post cholecystectomy."   Prior colonoscopy records from [facility] never rec'd despite records request  >25 minutes late for visit today ________________________________________   [place] was here for several issues today. Her chronic a.m. nausea continues, but typically is very brief, resolving within several minutes after awakening as long she takes her domperidone 10 mg at bedtime.  If she did not take it, she is confident she would have nausea and vomiting during the day. She continues to struggle with bowel habits alternating between constipation and diarrhea.  She has significant ongoing stress, and is about to get custody of her second grandchild.  She tries to eat a healthy diet, control calories and stay active but is frustrated that she cannot lose weight.  Primary care had apparently prescribed some phentermine and also adjusted her thyroid medicine.  In early April her hemoglobin was 11.5 with MCV of 80, platelets 458,  normal WBC.  Alkaline phosphatase elevated at 159 (normal at that lab is up to 117) but other LFTs normal. Iron level low for that lab at 39 (there lower limit of normal was 50), no TIBC or ferritin done.  She is on iron tablets that is "wrecking my stomach".  Repeat hemoglobin 11.7 last week.   ROS: Cardiovascular:  no chest pain Respiratory: no dyspnea Remainder of systems negative except as above The patient's Past Medical, Family and Social History  were reviewed and are on file in the EMR.  Objective:  Med list reviewed  Current Outpatient Medications:  .  AMBULATORY NON FORMULARY MEDICATION, Take 1 tablet by mouth at bedtime. Medication Name: Domperidone 10 mg tablet, Disp: 200 tablet, Rfl: 1 .  CARTIA XT 240 MG 24 hr capsule, Take 240 mg by mouth every evening. , Disp: , Rfl:  .  Cholecalciferol (VITAMIN D3) 1.25 MG (50000 UT) CAPS, Take 1 capsule by mouth once a week., Disp: , Rfl:  .  clonazePAM (KLONOPIN) 0.5 MG tablet, Take 0.5 mg by mouth daily. , Disp: , Rfl:  .  cyanocobalamin (,VITAMIN B-12,) 1000 MCG/ML injection, Inject 1,000 mcg into the muscle every 30 (thirty) days., Disp: , Rfl:  .  escitalopram (LEXAPRO) 20 MG tablet, Take 20 mg by mouth every evening. , Disp: , Rfl:  .  FERRETTS 325 (106 Fe) MG TABS tablet, Take 1 tablet by mouth daily., Disp: , Rfl:  .  ibuprofen (ADVIL,MOTRIN) 800 MG tablet, Take 800 mg by mouth every 8 (eight) hours as needed for mild pain or moderate pain. , Disp: , Rfl:  .  metoCLOPramide (REGLAN) 5 MG tablet, Take 1 tablet by mouth twice daily, Disp: 120 tablet, Rfl: 0 .  SYNTHROID 100 MCG tablet, Take 100 mcg by mouth every morning., Disp: , Rfl:    Vital signs in last 24 hrs: Vitals:   07/26/19 1607  BP: 114/70  Pulse: 80    Physical Exam  Well-appearing, pleasant and conversational  HEENT: sclera anicteric, oral mucosa moist without lesions  Neck: supple, no thyromegaly, JVD or lymphadenopathy  Cardiac: RRR without murmurs, S1S2 heard, no peripheral edema  Pulm: clear to auscultation bilaterally, normal RR and effort noted  Abdomen: soft, upper tenderness tenderness, with active bowel sounds. No guarding or palpable hepatosplenomegaly.  Skin; warm and dry, no jaundice or rash  Labs:  As noted above.  Free T4 normal. ___________________________________________ Radiologic studies:  None recent ____________________________________________ Other: 2018 duodenal biopsies  had mild increase in mucosal lymphocytes, no villous blunting.  _____________________________________________ Assessment & Plan  Assessment: Encounter Diagnoses  Name Primary?  . Nausea in adult Yes  . Irritable bowel syndrome with both constipation and diarrhea   . Iron deficiency anemia, unspecified iron deficiency anemia type    Years of chronic nausea that I feel is probably a gastric dysrhythmia and tied to her functional bowel disorder.  Under fairly good control with domperidone now, still has lower GI side effects but not neurological risks like metoclopramide.  Primary care advised to check EKG regularly to monitor QT interval (previously normal).  New issue of mild decrease in hemoglobin with low normal MCV.  Iron level low for that labs threshold, normal at our lab.  Previous duodenal biopsy was nonspecific but not clearly convincing for celiac sprue. Findings and timing of previous colonoscopy in [place] unknown, cannot get records.  Plan: EGD and colonoscopy.  She is agreeable after discussion of procedure and risks.  The benefits and risks  of the planned procedure were described in detail with the patient or (when appropriate) their health care proxy.  Risks were outlined as including, but not limited to, bleeding, infection, perforation, adverse medication reaction leading to cardiac or pulmonary decompensation, pancreatitis (if ERCP).  The limitation of incomplete mucosal visualization was also discussed.  No guarantees or warranties were given.  Biopsy duodenum and colon to rule out sprue and microscopic colitis.  Discussed with primary care, but consider stopping oral iron since it is not helping and it is causing significant exacerbation of her functional bowel symptoms.  Recommend primary care check a GGT with her next set of LFTs because the alkaline phosphatase might not be hepatic in origin.  If alkaline phosphatase and GGT both elevated, obtain right upper quadrant  ultrasound and refer patient back to [place].  35 minutes were spent on this encounter (including chart review, history/exam, counseling/coordination of care, and documentation)  Nelida Meuse III

## 2019-07-26 NOTE — Patient Instructions (Addendum)
If you are age 56 or older, your body mass index should be between 23-30. Your Body mass index is 32.05 kg/m. If this is out of the aforementioned range listed, please consider follow up with your Primary Care Provider.  If you are age 69 or younger, your body mass index should be between 19-25. Your Body mass index is 32.05 kg/m. If this is out of the aformentioned range listed, please consider follow up with your Primary Care Provider.   You have been scheduled for an endoscopy and colonoscopy. Please follow the written instructions given to you at your visit today. Please pick up your prep supplies at the pharmacy within the next 1-3 days. If you use inhalers (even only as needed), please bring them with you on the day of your procedure. Your physician has requested that you go to www.startemmi.com and enter the access code given to you at your visit today. This web site gives a general overview about your procedure. However, you should still follow specific instructions given to you by our office regarding your preparation for the procedure.  It was a pleasure to see you today!  Dr. Loletha Carrow

## 2019-08-11 ENCOUNTER — Telehealth: Payer: Self-pay | Admitting: Gastroenterology

## 2019-08-12 NOTE — Telephone Encounter (Signed)
Please see below and advise.

## 2019-08-12 NOTE — Telephone Encounter (Signed)
Spoke with pt and she is aware. States she may have them done with her PCP so that she can bring the results to Dr. Loletha Carrow.

## 2019-08-12 NOTE — Telephone Encounter (Signed)
I recommended CBC and iron levels be followed with her primary care provider, along with the liver labs.  If she wants to do that here, then please just ask her to remind me the day of her procedures and we'll draw it at our lab on her way out.

## 2019-08-19 ENCOUNTER — Encounter: Payer: PRIVATE HEALTH INSURANCE | Admitting: Gastroenterology

## 2019-08-23 ENCOUNTER — Encounter: Payer: Self-pay | Admitting: Gastroenterology

## 2019-08-26 ENCOUNTER — Other Ambulatory Visit: Payer: Self-pay

## 2019-08-26 ENCOUNTER — Encounter: Payer: Self-pay | Admitting: Gastroenterology

## 2019-08-26 ENCOUNTER — Ambulatory Visit (AMBULATORY_SURGERY_CENTER): Payer: PRIVATE HEALTH INSURANCE | Admitting: Gastroenterology

## 2019-08-26 VITALS — BP 113/73 | HR 68 | Temp 97.3°F | Resp 20 | Ht 62.0 in | Wt 180.0 lb

## 2019-08-26 DIAGNOSIS — K529 Noninfective gastroenteritis and colitis, unspecified: Secondary | ICD-10-CM

## 2019-08-26 DIAGNOSIS — D131 Benign neoplasm of stomach: Secondary | ICD-10-CM | POA: Diagnosis not present

## 2019-08-26 DIAGNOSIS — D509 Iron deficiency anemia, unspecified: Secondary | ICD-10-CM

## 2019-08-26 MED ORDER — SODIUM CHLORIDE 0.9 % IV SOLN: 500.0000 mL | Freq: Once | INTRAVENOUS | Status: DC

## 2019-08-26 NOTE — Progress Notes (Signed)
To PACU, VSS. Report to Rn. Physician aware of additional meds. tb 

## 2019-08-26 NOTE — Op Note (Signed)
[facility] Patient Name: [facility] Procedure Date: 08/26/2019 2:34 PM MRN: [id number] Endoscopist: Mallie Mussel L. Loletha Carrow , MD Age: 54 Referring MD:  Date of Birth: 1965/10/12 Gender: Female Account #: [id number] Procedure:                Upper GI endoscopy Indications:              Unexplained iron deficiency anemia (iron level 39,                            Hgb 11.5, MCV 80) Medicines:                Monitored Anesthesia Care Procedure:                Pre-Anesthesia Assessment:                           - Prior to the procedure, a History and Physical                            was performed, and patient medications and                            allergies were reviewed. The patient's tolerance of                            previous anesthesia was also reviewed. The risks                            and benefits of the procedure and the sedation                            options and risks were discussed with the patient.                            All questions were answered, and informed consent                            was obtained. Prior Anticoagulants: The patient has                            taken no previous anticoagulant or antiplatelet                            agents. ASA Grade Assessment: II - A patient with                            mild systemic disease. After reviewing the risks                            and benefits, the patient was deemed in                            satisfactory condition to undergo the procedure.  After obtaining informed consent, the endoscope was                            passed under direct vision. Throughout the                            procedure, the patient's blood pressure, pulse, and                            oxygen saturations were monitored continuously. The                            Endoscope was introduced through the mouth, and                            advanced to the second part of duodenum.  The upper                            GI endoscopy was accomplished without difficulty.                            The patient tolerated the procedure well. Scope In: Scope Out: Findings:                 The esophagus was normal.                           Multiple small sessile fundic gland polyps were                            found in the gastric body.                           The exam of the stomach was otherwise normal.                           The cardia and gastric fundus were normal on                            retroflexion.                           The examined duodenum was normal. Biopsies for                            histology were taken with a cold forceps for                            evaluation of celiac disease. Complications:            No immediate complications. Estimated Blood Loss:     Estimated blood loss was minimal. Impression:               - Normal esophagus.                           -  Multiple fundic gland polyps.                           - Normal examined duodenum. Biopsied.                           This anemia does not appear to be on the basis of                            GI blood loss. Recommendation:           - Patient has a contact number available for                            emergencies. The signs and symptoms of potential                            delayed complications were discussed with the                            patient. Return to normal activities tomorrow.                            Written discharge instructions were provided to the                            patient.                           - Resume previous diet.                           - Continue present medications.                           - Await pathology results.                           - See the other procedure note for documentation of                            additional recommendations.                           - Follow up with primary care provider for  periodic                            check of hemoglobin and refer back to GI as needed. Brandley Aldrete L. Loletha Carrow, MD 08/26/2019 3:26:42 PM This report has been signed electronically.

## 2019-08-26 NOTE — Op Note (Addendum)
[place] Patient Name: Sherry Cameron Procedure Date: 08/26/2019 2:34 PM MRN: [id number] Endoscopist: Mallie Mussel L. Loletha Carrow , MD Age: 54 Referring MD:  Date of Birth: Dec 14, 1965 Gender: Female Account #: [id number] Procedure:                Colonoscopy Indications:              Iron deficiency anemia, April 2021 (iron 39, Hgb                            11.5, MCV 80) - stable Hgb on 08/24/19 CBC, patient                            unable to tolerate oral iron due to GI side                            effects; chronic diarrhea alternating with                            constipation Medicines:                Monitored Anesthesia Care Procedure:                Pre-Anesthesia Assessment:                           - Prior to the procedure, a History and Physical                            was performed, and patient medications and                            allergies were reviewed. The patient's tolerance of                            previous anesthesia was also reviewed. The risks                            and benefits of the procedure and the sedation                            options and risks were discussed with the patient.                            All questions were answered, and informed consent                            was obtained. Prior Anticoagulants: The patient has                            taken no previous anticoagulant or antiplatelet                            agents. ASA Grade Assessment: II - A patient with  mild systemic disease. After reviewing the risks                            and benefits, the patient was deemed in                            satisfactory condition to undergo the procedure.                           After obtaining informed consent, the colonoscope                            was passed under direct vision. Throughout the                            procedure, the patient's blood pressure, pulse, and                             oxygen saturations were monitored continuously. The                            Colonoscope was introduced through the anus and                            advanced to the the cecum, identified by                            appendiceal orifice and ileocecal valve. The                            colonoscopy was somewhat difficult due to a                            redundant colon. Successful completion of the                            procedure was aided by using manual pressure. The                            patient tolerated the procedure well. The quality                            of the bowel preparation was excellent. The                            ileocecal valve, appendiceal orifice, and rectum                            were photographed. The bowel preparation used was                            Miralax. Scope In: 2:46:37 PM Scope Out: 3:03:37 PM Scope Withdrawal Time: 0 hours 7 minutes 56 seconds  Total Procedure Duration: 0 hours 17 minutes 0  seconds  Findings:                 The perianal and digital rectal examinations were                            normal.                           Normal mucosa was found in the entire colon.                            Biopsies for histology were taken with a cold                            forceps from the right colon and left colon for                            evaluation of microscopic colitis. (TI could not be                            intubated due to scope looping)                           The exam was otherwise without abnormality on                            direct and retroflexion views. Complications:            No immediate complications. Estimated Blood Loss:     Estimated blood loss was minimal. Impression:               - Normal mucosa in the entire examined colon.                            Biopsied.                           - The examination was otherwise normal on direct                            and  retroflexion views. Recommendation:           - Patient has a contact number available for                            emergencies. The signs and symptoms of potential                            delayed complications were discussed with the                            patient. Return to normal activities tomorrow.                            Written discharge instructions were provided to the  patient.                           - Resume previous diet.                           - Continue present medications.                           - Await pathology results.                           - Repeat colonoscopy in 10 years for screening                            purposes.                           - See the other procedure note for documentation of                            additional recommendations. Ejay Lashley L. Loletha Carrow, MD 08/26/2019 3:18:50 PM This report has been signed electronically.

## 2019-08-26 NOTE — Progress Notes (Signed)
Called to room to assist during endoscopic procedure.  Patient ID and intended procedure confirmed with present staff. Received instructions for my participation in the procedure from the performing physician.  

## 2019-08-26 NOTE — Progress Notes (Signed)
[facility]

## 2019-08-26 NOTE — Patient Instructions (Signed)
Please read handouts provided. Continue present medications. Await pathology results. Follow up with primary care provider for periodic check of hemoglobin and refer back to GI as needed.     YOU HAD AN ENDOSCOPIC PROCEDURE TODAY AT [facility]:   Refer to the procedure report that was given to you for any specific questions about what was found during the examination.  If the procedure report does not answer your questions, please call your gastroenterologist to clarify.  If you requested that your care partner not be given the details of your procedure findings, then the procedure report has been included in a sealed envelope for you to review at your convenience later.  YOU SHOULD EXPECT: Some feelings of bloating in the abdomen. Passage of more gas than usual.  Walking can help get rid of the air that was put into your GI tract during the procedure and reduce the bloating. If you had a lower endoscopy (such as a colonoscopy or flexible sigmoidoscopy) you may notice spotting of blood in your stool or on the toilet paper. If you underwent a bowel prep for your procedure, you may not have a normal bowel movement for a few days.  Please Note:  You might notice some irritation and congestion in your nose or some drainage.  This is from the oxygen used during your procedure.  There is no need for concern and it should clear up in a day or so.  SYMPTOMS TO REPORT IMMEDIATELY:   Following lower endoscopy (colonoscopy or flexible sigmoidoscopy):  Excessive amounts of blood in the stool  Significant tenderness or worsening of abdominal pains  Swelling of the abdomen that is new, acute  Fever of 100F or higher   Following upper endoscopy (EGD)  Vomiting of blood or coffee ground material  New chest pain or pain under the shoulder blades  Painful or persistently difficult swallowing  New shortness of breath  Fever of 100F or higher  Black, tarry-looking stools  For urgent  or emergent issues, a gastroenterologist can be reached at any hour by calling [phone]. Do not use MyChart messaging for urgent concerns.    DIET:  We do recommend a small meal at first, but then you may proceed to your regular diet.  Drink plenty of fluids but you should avoid alcoholic beverages for 24 hours.  ACTIVITY:  You should plan to take it easy for the rest of today and you should NOT DRIVE or use heavy machinery until tomorrow (because of the sedation medicines used during the test).    FOLLOW UP: Our staff will call the number listed on your records 48-72 hours following your procedure to check on you and address any questions or concerns that you may have regarding the information given to you following your procedure. If we do not reach you, we will leave a message.  We will attempt to reach you two times.  During this call, we will ask if you have developed any symptoms of COVID 19. If you develop any symptoms (ie: fever, flu-like symptoms, shortness of breath, cough etc.) before then, please call [phone].  If you test positive for Covid 19 in the 2 weeks post procedure, please call and report this information to [place].    If any biopsies were taken you will be contacted by phone or by letter within the next 1-3 weeks.  Please call us at [phone] if you have not heard about the biopsies in 3 weeks.  SIGNATURES/CONFIDENTIALITY: You and/or your care partner have signed paperwork which will be entered into your electronic medical record.  These signatures attest to the fact that that the information above on your After Visit Summary has been reviewed and is understood.  Full responsibility of the confidentiality of this discharge information lies with you and/or your care-partner.

## 2019-08-30 ENCOUNTER — Telehealth: Payer: Self-pay | Admitting: *Deleted

## 2019-08-30 NOTE — Telephone Encounter (Signed)
1. Have you developed a fever since your procedure? no  2.   Have you had an respiratory symptoms (SOB or cough) since your procedure? no  3.   Have you tested positive for COVID 19 since your procedure no  4.   Have you had any family members/close contacts diagnosed with the COVID 19 since your procedure?  no   If yes to any of these questions please route to Joylene John, RN and Erenest Rasher, RN Follow up Call-  Call back number 08/26/2019  Post procedure Call Back phone  # [phone]  Permission to leave phone message Yes  Some recent data might be hidden     Patient questions:  Do you have a fever, pain , or abdominal swelling? No. Pain Score  0 *  Have you tolerated food without any problems? Yes.    Have you been able to return to your normal activities? Yes.    Do you have any questions about your discharge instructions: Diet   No. Medications  No. Follow up visit  No.  Do you have questions or concerns about your Care? No.  Actions: * If pain score is 4 or above: No action needed, pain <4.

## 2019-12-27 ENCOUNTER — Encounter: Payer: Self-pay | Admitting: Gastroenterology

## 2021-09-10 ENCOUNTER — Encounter: Payer: Self-pay | Admitting: Gastroenterology

## 2021-09-10 ENCOUNTER — Ambulatory Visit: Payer: BC Managed Care – PPO | Admitting: Gastroenterology

## 2021-09-10 VITALS — BP 100/62 | HR 60 | Ht 62.0 in | Wt 146.2 lb

## 2021-09-10 DIAGNOSIS — R12 Heartburn: Secondary | ICD-10-CM

## 2021-09-10 DIAGNOSIS — R142 Eructation: Secondary | ICD-10-CM | POA: Diagnosis not present

## 2021-09-10 DIAGNOSIS — R112 Nausea with vomiting, unspecified: Secondary | ICD-10-CM | POA: Diagnosis not present

## 2021-09-10 DIAGNOSIS — R140 Abdominal distension (gaseous): Secondary | ICD-10-CM | POA: Diagnosis not present

## 2021-09-10 DIAGNOSIS — K580 Irritable bowel syndrome with diarrhea: Secondary | ICD-10-CM

## 2021-09-10 MED ORDER — ONDANSETRON HCL 4 MG PO TABS: ORAL_TABLET | ORAL | 2 refills | Status: AC

## 2021-09-10 MED ORDER — AMBULATORY NON FORMULARY MEDICATION: 1.0000 | Freq: Three times a day (TID) | 1 refills | Status: DC | PRN

## 2021-09-10 NOTE — Patient Instructions (Signed)
If you are age 58 or older, your body mass index should be between 23-30. Your Body mass index is 26.74 kg/m. If this is out of the aforementioned range listed, please consider follow up with your Primary Care Provider.  If you are age 42 or younger, your body mass index should be between 19-25. Your Body mass index is 26.74 kg/m. If this is out of the aformentioned range listed, please consider follow up with your Primary Care Provider.   ________________________________________________________  The [facility] providers would like to encourage you to use [facility] to communicate with providers for non-urgent requests or questions.  Due to long hold times on the telephone, sending your provider a message by [facility] may be a faster and more efficient way to get a response.  Please allow 48 business hours for a response.  Please remember that this is for non-urgent requests.  _______________________________________________________  We have sent the following medications to your pharmacy for you to pick up at your convenience: Zofran and we will send the Domperidone to [facility] [phone]  It was a pleasure to see you today!  Thank you for trusting me with your gastrointestinal care!

## 2021-09-10 NOTE — Progress Notes (Signed)
[facility] progress note:  History: ANANIAH MACIOLEK 09/10/2021  Reason for consult/chief complaint: Nausea (Has had several episodes of nausea and vomiting as well as reflux over the last couple of months. No precipitating factors. No abdominal pain. +belching/bloating) and Irritable Bowel Syndrome (C/o loose stool; no blood in the stool) Nausea    Subjective  HPI: Anasofia was last seen in the office June 2021.  She has years of nausea that I have believed to be a probable gastric dysrhythmia (see prior extensive office notes for details).  She had improvement on metoclopramide, then changed to domperidone obtained from a [facility] to decrease chance of neurologic side effects.  She also has a functional bowel disorder with alternating constipation and diarrhea somewhat exacerbated by the prokinetic medicine.  At last visit also had mild iron deficiency anemia and cannot tolerate oral iron due to GI side effects.  EGD and colonoscopy without any source of GI blood loss, duodenal biopsies negative for sprue, colon biopsies negative for microscopic colitis. She has also previously reported a significant stress component contributing to her GI symptoms. Several years ago symptoms were felt possibly to be from biliary colic, had improvement for a while after cholecystectomy before symptoms returned as before.  Trial of cholestyramine made her constipated.  At 1 point we talked about Lotronex, but she ultimately decided not to pursue that.  Starlett was here with her husband today.  Since I last saw her, she was eventually able to stop the domperidone because her symptoms were improved.  It is not clear what led to that improvement, but she was generally feeling well from the standpoint of nausea.  There would still be loose stool most days, usually within the 30 to 60 minutes after meals.  She denies rectal bleeding, appetite generally good, and she has been trying to lose some  weight.  Last fall she started using medical marijuana in some liquid form, and says it has been very helpful for her sleep.  2 or 3 months ago she began having recurrence of nausea, belching bloating and reflux symptoms.  She had domperidone left on hand and started it again with some improvement.  Stress level still remains quite high over sister with bipolar disorder as well as a daughter who is in recovery from opioid abuse who is teenage children Ia and her husband are raising   ROS:  Review of Systems  Constitutional:  Negative for appetite change and unexpected weight change.  HENT:  Negative for mouth sores and voice change.   Eyes:  Negative for pain and redness.  Respiratory:  Negative for cough and shortness of breath.   Cardiovascular:  Negative for chest pain and palpitations.  Genitourinary:  Negative for dysuria and hematuria.  Musculoskeletal:  Negative for arthralgias and myalgias.  Skin:  Negative for pallor and rash.  Neurological:  Negative for weakness and headaches.  Hematological:  Negative for adenopathy.     Past Medical History: Past Medical History:  Diagnosis Date   Belching    Hyperlipidemia    Hypertension    Thyroid disease    Vitamin B12 deficiency    Vitamin D deficiency      Past Surgical History: Past Surgical History:  Procedure Laterality Date   CHOLECYSTECTOMY N/A 07/11/2016   Procedure: LAPAROSCOPIC CHOLECYSTECTOMY;  Surgeon: Clovis Riley, MD;  Location: [facility];  Service: General;  Laterality: N/A;   COLONOSCOPY     TONSILLECTOMY AND ADENOIDECTOMY  TUBAL LIGATION     UPPER GASTROINTESTINAL ENDOSCOPY       Family History: Family History  Problem Relation Age of Onset   Dementia Mother    Stroke Mother    Cancer - Lung Father    Esophageal cancer Father        felt secondary to lung cancer   Heart disease Brother        felt related to drug use   Diabetes Brother    Colon cancer Neg Hx    Colon polyps Neg Hx     Stomach cancer Neg Hx    Pancreatic cancer Neg Hx    Rectal cancer Neg Hx     Social History: Social History   Socioeconomic History   Marital status: Married    Spouse name: Not on file   Number of children: 2   Years of education: 12   Highest education level: Not on file  Occupational History    Comment: Sales promotion account executive.  Tobacco Use   Smoking status: Every Day    Types: Cigarettes   Smokeless tobacco: Never  Vaping Use   Vaping Use: Never used  Substance and Sexual Activity   Alcohol use: Yes    Comment: occ. social   Drug use: No   Sexual activity: Not on file  Other Topics Concern   Not on file  Social History Narrative   Patient lives at home with her husband. Patient works full time as a Loss adjuster, [employer].    Education  High school.   Right handed.   Caffeine three sweet teas daily.         Social Determinants of Health   Financial Resource Strain: Not on file  Food Insecurity: Not on file  Transportation Needs: Not on file  Physical Activity: Not on file  Stress: Not on file  Social Connections: Not on file    Allergies: Allergies  Allergen Reactions   Contrast Media [Iodinated Contrast Media] Anaphylaxis, Hives and Itching   Her husband says that she drinks at least a gallon of tea every day.  Outpatient Meds: Current Outpatient Medications  Medication Sig Dispense Refill   CARTIA XT 240 MG 24 hr capsule Take 240 mg by mouth every evening.      clonazePAM (KLONOPIN) 0.5 MG tablet Take 0.5 mg by mouth daily.     cyanocobalamin (,VITAMIN B-12,) 1000 MCG/ML injection Inject 1,000 mcg into the muscle every 30 (thirty) days.     escitalopram (LEXAPRO) 20 MG tablet Take 20 mg by mouth every evening.      ibuprofen (ADVIL,MOTRIN) 800 MG tablet Take 800 mg by mouth every 8 (eight) hours as needed for mild pain or moderate pain.      levothyroxine (SYNTHROID) 125 MCG tablet Take 125 mcg by mouth daily before breakfast.     ondansetron (ZOFRAN) 4 MG tablet  Take 1 every 8 hours as needed for breakthrough nausea 60 tablet 2   AMBULATORY NON FORMULARY MEDICATION Take 1 tablet by mouth 3 (three) times daily as needed. Medication Name: Domperidone 10 mg tablet 270 tablet 1   No current facility-administered medications for this visit.      ___________________________________________________________________ Objective   Exam:  BP 100/62   Pulse 60   Ht '5\' 2"'$  (1.575 m)   Wt 146 lb 3.2 oz (66.3 kg)   LMP 05/31/2015   BMI 26.74 kg/m  Wt Readings from Last 3 Encounters:  09/10/21 146 lb 3.2 oz (66.3 kg)  08/26/19 180  lb (81.6 kg)  07/26/19 175 lb 4 oz (79.5 kg)    General: Pleasant, well-appearing, no muscle wasting Eyes: sclera anicteric, no redness ENT: oral mucosa moist without lesions, no cervical or supraclavicular lymphadenopathy CV: Regular without murmur, no JVD, no peripheral edema Resp: clear to auscultation bilaterally, normal RR and effort noted GI: soft, mild epigastric tenderness, with active bowel sounds. No guarding or palpable organomegaly noted. Skin; warm and dry, no rash or jaundice noted Neuro: awake, alert and oriented x 3. Normal gross motor function and fluent speech  Labs: No recent data for review  Assessment: Encounter Diagnoses  Name Primary?   Nausea and vomiting in adult Yes   Abdominal bloating    Belching    Heartburn     She has recurrence of her upper digestive symptoms that are similar to before.  I do not think she developed a new upper digestive condition.  It is possible the cumulative use of THC may be leading to a cannabis hyperemesis type picture on top of her underlying functional disorder.  Recommended she speak with that prescribing provider about using a CBD product rather than one containing THC.  I refilled her domperidone prescription to get it from her [facility].  Typically she is just needed at bedtime with occasional daytime dose.  I also prescribed Zofran 4 mg every 8  hours as needed for breakthrough symptoms, as that also might help her diarrhea.  She could take as needed Imodium instead if she chooses.  No further testing planned at this point, and she was encouraged to contact me within the next month regarding how she is feeling.   40 minutes were spent on this encounter (including chart review, history/exam, counseling/coordination of care, and documentation) > 50% of that time was spent on counseling and coordination of care.   Nelida Meuse III  CC: Referring provider noted above

## 2022-12-16 ENCOUNTER — Telehealth: Payer: Self-pay | Admitting: Gastroenterology

## 2022-12-16 NOTE — Telephone Encounter (Signed)
Inbound call from patient stating that she needs a refill for nausea. Patient stated she needs medication sent to [place]. Please advise?

## 2022-12-17 ENCOUNTER — Other Ambulatory Visit: Payer: Self-pay

## 2022-12-17 MED ORDER — AMBULATORY NON FORMULARY MEDICATION: 1.0000 | Freq: Three times a day (TID) | 1 refills | Status: AC | PRN

## 2023-12-04 ENCOUNTER — Ambulatory Visit: Payer: PRIVATE HEALTH INSURANCE | Admitting: Orthopaedic Surgery

## 2023-12-08 ENCOUNTER — Ambulatory Visit: Payer: PRIVATE HEALTH INSURANCE | Admitting: Orthopaedic Surgery

## 2023-12-22 ENCOUNTER — Ambulatory Visit: Payer: PRIVATE HEALTH INSURANCE | Admitting: Orthopaedic Surgery

## 2023-12-22 DIAGNOSIS — M1611 Unilateral primary osteoarthritis, right hip: Secondary | ICD-10-CM

## 2023-12-22 NOTE — Progress Notes (Signed)
 Office Visit Note   Patient: Sherry Cameron           Date of Birth: 1965/03/04           MRN: [id number] Visit Date: 12/22/2023              Requested by: Tripathi, Kavita, MD [address] [place],  [place] [id number] PCP: Tripathi, Kavita, MD   Assessment & Plan: Visit Diagnoses:  1. Primary osteoarthritis of right hip     Plan: History of Present Illness Sherry Cameron is a 58 year old female who presents with right hip pain. She is accompanied by her sister.  She experiences deep hip pain primarily in the groin and buttock areas, radiating to her knee. The pain is most noticeable when she first stands up, requiring a few steps to walk without a limp. Sitting in a car or at a desk exacerbates the pain, although it subsides after a while. She reports 'startup pain' when standing.  She has tried strength exercises with an orthopedic specialist for three to four weeks without symptom relief. Dry needling therapy, performed three times, effectively eliminated her nerve pain. She uses ibuprofen at night to manage discomfort, especially when sleeping on her side. In May, she received a cortisone injection in her hip bursa, which provided some relief. She has also tried different chairs, cushions, and a shoe insert on the left side, provided by her last physical therapist.  Physical Exam MUSCULOSKELETAL: Log rolling of the right leg causes ache in the posterior hip. Flexion of the right hip causes discomfort. Rotation of the right hip causes pain, especially with internal rotation. Resisted hip flexion causes mild groin pain.  Results RADIOLOGY Right hip X-ray: Evidence of arthritis with osteophyte formation on the acetabular and femoral head, osteophyte formation at the inferior and superior femoral head, no joint space narrowing.  Assessment and Plan Primary osteoarthritis of the right hip Chronic right hip pain with arthritic changes on X-ray. Conservative management preferred.  Discussed potential for hip replacement if symptoms worsen. Explained NSAID risks. She prefers cortisone injection due to stomach issues. - Referred to Dr. Burnetta for ultrasound-guided cortisone injection into the right hip joint. - Continue conservative management with physical therapy and dry needling as needed. - Consider NSAIDs like meloxicam or diclofenac with caution due to potential kidney effects and stomach issues.  Follow-Up Instructions: No follow-ups on file.   Orders:  No orders of the defined types were placed in this encounter.  No orders of the defined types were placed in this encounter.     Procedures: No procedures performed   Clinical Data: No additional findings.   Subjective: Chief Complaint  Patient presents with   Right Hip - Pain    HPI  Review of Systems  Constitutional: Negative.   HENT: Negative.    Eyes: Negative.   Respiratory: Negative.    Cardiovascular: Negative.   Endocrine: Negative.   Musculoskeletal: Negative.   Neurological: Negative.   Hematological: Negative.   Psychiatric/Behavioral: Negative.    All other systems reviewed and are negative.    Objective: Vital Signs: LMP 05/31/2015   Physical Exam Vitals and nursing note reviewed.  Constitutional:      Appearance: She is well-developed.  HENT:     Head: Atraumatic.     Nose: Nose normal.  Eyes:     Extraocular Movements: Extraocular movements intact.  Cardiovascular:     Pulses: Normal pulses.  Pulmonary:     Effort:  Pulmonary effort is normal.  Abdominal:     Palpations: Abdomen is soft.  Musculoskeletal:     Cervical back: Neck supple.  Skin:    General: Skin is warm.     Capillary Refill: Capillary refill takes less than 2 seconds.  Neurological:     Mental Status: She is alert. Mental status is at baseline.  Psychiatric:        Behavior: Behavior normal.        Thought Content: Thought content normal.        Judgment: Judgment normal.     Ortho  Exam  Specialty Comments:  No specialty comments available.  Imaging: No results found.   PMFS History: Patient Active Problem List   Diagnosis Date Noted   Symptomatic cholelithiasis 07/11/2016   Neck pain on left side 09/14/2013   Vertigo 08/18/2013   Double vision 08/18/2013   Migraine    Past Medical History:  Diagnosis Date   Belching    Hyperlipidemia    Hypertension    Thyroid disease    Vitamin B12 deficiency    Vitamin D deficiency     Family History  Problem Relation Age of Onset   Dementia Mother    Stroke Mother    Cancer - Lung Father    Esophageal cancer Father        felt secondary to lung cancer   Heart disease Brother        felt related to drug use   Diabetes Brother    Colon cancer Neg Hx    Colon polyps Neg Hx    Stomach cancer Neg Hx    Pancreatic cancer Neg Hx    Rectal cancer Neg Hx     Past Surgical History:  Procedure Laterality Date   CHOLECYSTECTOMY N/A 07/11/2016   Procedure: LAPAROSCOPIC CHOLECYSTECTOMY;  Surgeon: Signe Mitzie LABOR, MD;  Location: [facility];  Service: General;  Laterality: N/A;   COLONOSCOPY     TONSILLECTOMY AND ADENOIDECTOMY     TUBAL LIGATION     UPPER GASTROINTESTINAL ENDOSCOPY     Social History   Occupational History    Comment: Geophysicist/field Seismologist.  Tobacco Use   Smoking status: Every Day    Types: Cigarettes   Smokeless tobacco: Never  Vaping Use   Vaping status: Never Used  Substance and Sexual Activity   Alcohol use: Yes    Comment: occ. social   Drug use: No   Sexual activity: Not on file

## 2024-01-01 ENCOUNTER — Ambulatory Visit: Payer: PRIVATE HEALTH INSURANCE | Admitting: Sports Medicine

## 2024-01-01 ENCOUNTER — Other Ambulatory Visit: Payer: Self-pay

## 2024-01-01 DIAGNOSIS — G8929 Other chronic pain: Secondary | ICD-10-CM

## 2024-01-01 DIAGNOSIS — M1611 Unilateral primary osteoarthritis, right hip: Secondary | ICD-10-CM | POA: Diagnosis not present

## 2024-01-01 DIAGNOSIS — M25551 Pain in right hip: Secondary | ICD-10-CM | POA: Diagnosis not present

## 2024-01-01 MED ORDER — LIDOCAINE HCL 1 % IJ SOLN
4.0000 mL | INTRAMUSCULAR | Status: AC | PRN
  Administered 2024-01-01: 4 mL

## 2024-01-01 MED ORDER — METHYLPREDNISOLONE ACETATE 40 MG/ML IJ SUSP
80.0000 mg | INTRAMUSCULAR | Status: AC | PRN
  Administered 2024-01-01: 80 mg via INTRA_ARTICULAR

## 2024-01-01 NOTE — Progress Notes (Signed)
   Procedure Note  Patient: Sherry Cameron             Date of Birth: 1965-02-22           MRN: [id number]             Visit Date: 01/01/2024  Procedures: Visit Diagnoses:  1. Primary osteoarthritis of right hip   2. Chronic right hip pain    Large Joint Inj: R hip joint on 01/01/2024 3:05 PM Indications: pain Details: 22 G 3.5 in needle, ultrasound-guided anterior approach Medications: 4 mL lidocaine  1 %; 80 mg methylPREDNISolone acetate 40 MG/ML Outcome: tolerated well, no immediate complications  Procedure: US -guided intra-articular hip injection, Right After discussion on risks/benefits/indications and informed verbal consent was obtained, a timeout was performed. Patient was lying supine on exam table. The hip was cleaned with betadine and alcohol swabs. Then utilizing ultrasound guidance, the patient's femoral head and neck junction was identified and subsequently injected with 4:2 lidocaine :depomedrol via an in-plane approach with ultrasound visualization of the injectate administered into the hip joint. Patient tolerated procedure well without immediate complications.  Procedure, treatment alternatives, risks and benefits explained, specific risks discussed. Consent was given by the patient. Immediately prior to procedure a time out was called to verify the correct patient, procedure, equipment, support staff and site/side marked as required. Patient was prepped and draped in the usual sterile fashion.     - patient tolerated procedure well, discussed post-injection protocol - follow-up with Dr. Jerri as indicated; I am happy to see them as needed  Lonell Sprang, DO Primary Care Sports Medicine Physician  [facility]  This note was dictated using Dragon naturally speaking software and may contain errors in syntax, spelling, or content which have not been identified prior to signing this note.
